# Patient Record
Sex: Female | Born: 1935 | Race: Black or African American | Hispanic: No | Smoking: Never smoker
Health system: Southern US, Community
[De-identification: ages and names within clinical notes are randomized; demographics above are authoritative.]

## PROBLEM LIST (undated history)

## (undated) DIAGNOSIS — R001 Bradycardia, unspecified: Secondary | ICD-10-CM

## (undated) DIAGNOSIS — D649 Anemia, unspecified: Secondary | ICD-10-CM

## (undated) DIAGNOSIS — N189 Chronic kidney disease, unspecified: Secondary | ICD-10-CM

## (undated) DIAGNOSIS — R739 Hyperglycemia, unspecified: Secondary | ICD-10-CM

## (undated) DIAGNOSIS — I509 Heart failure, unspecified: Secondary | ICD-10-CM

## (undated) DIAGNOSIS — E611 Iron deficiency: Secondary | ICD-10-CM

## (undated) DIAGNOSIS — E785 Hyperlipidemia, unspecified: Secondary | ICD-10-CM

## (undated) DIAGNOSIS — R1312 Dysphagia, oropharyngeal phase: Secondary | ICD-10-CM

## (undated) DIAGNOSIS — R0609 Other forms of dyspnea: Secondary | ICD-10-CM

## (undated) DIAGNOSIS — I639 Cerebral infarction, unspecified: Secondary | ICD-10-CM

## (undated) DIAGNOSIS — G8929 Other chronic pain: Secondary | ICD-10-CM

## (undated) DIAGNOSIS — R51 Headache: Secondary | ICD-10-CM

## (undated) DIAGNOSIS — R1084 Generalized abdominal pain: Secondary | ICD-10-CM

## (undated) DIAGNOSIS — I110 Hypertensive heart disease with heart failure: Secondary | ICD-10-CM

## (undated) DIAGNOSIS — J3800 Paralysis of vocal cords and larynx, unspecified: Secondary | ICD-10-CM

## (undated) DIAGNOSIS — I272 Pulmonary hypertension, unspecified: Secondary | ICD-10-CM

## (undated) DIAGNOSIS — I351 Nonrheumatic aortic (valve) insufficiency: Secondary | ICD-10-CM

## (undated) DIAGNOSIS — E559 Vitamin D deficiency, unspecified: Secondary | ICD-10-CM

## (undated) DIAGNOSIS — I517 Cardiomegaly: Secondary | ICD-10-CM

## (undated) DIAGNOSIS — G4733 Obstructive sleep apnea (adult) (pediatric): Secondary | ICD-10-CM

## (undated) DIAGNOSIS — L659 Nonscarring hair loss, unspecified: Secondary | ICD-10-CM

## (undated) DIAGNOSIS — K635 Polyp of colon: Secondary | ICD-10-CM

## (undated) DIAGNOSIS — Z7901 Long term (current) use of anticoagulants: Secondary | ICD-10-CM

## (undated) DIAGNOSIS — R270 Ataxia, unspecified: Secondary | ICD-10-CM

## (undated) DIAGNOSIS — I6990 Unspecified sequelae of unspecified cerebrovascular disease: Secondary | ICD-10-CM

## (undated) DIAGNOSIS — I10 Essential (primary) hypertension: Secondary | ICD-10-CM

## (undated) DIAGNOSIS — E039 Hypothyroidism, unspecified: Secondary | ICD-10-CM

## (undated) DIAGNOSIS — K259 Gastric ulcer, unspecified as acute or chronic, without hemorrhage or perforation: Secondary | ICD-10-CM

## (undated) DIAGNOSIS — R0600 Dyspnea, unspecified: Secondary | ICD-10-CM

## (undated) DIAGNOSIS — I739 Peripheral vascular disease, unspecified: Secondary | ICD-10-CM

## (undated) HISTORY — DX: Nonrheumatic aortic (valve) insufficiency: I35.1

## (undated) HISTORY — DX: Chronic kidney disease, unspecified: N18.9

## (undated) HISTORY — DX: Hypothyroidism, unspecified: E03.9

## (undated) HISTORY — DX: Dyspnea, unspecified: R06.00

## (undated) HISTORY — PX: TONSILLECTOMY: SUR1361

## (undated) HISTORY — DX: Dysphagia, oropharyngeal phase: R13.12

## (undated) HISTORY — DX: Anemia, unspecified: D64.9

## (undated) HISTORY — DX: Pulmonary hypertension, unspecified: I27.20

## (undated) HISTORY — DX: Paralysis of vocal cords and larynx, unspecified: J38.00

## (undated) HISTORY — DX: Unspecified sequelae of unspecified cerebrovascular disease: I69.90

## (undated) HISTORY — DX: Obstructive sleep apnea (adult) (pediatric): G47.33

## (undated) HISTORY — DX: Heart failure, unspecified: I50.9

## (undated) HISTORY — DX: Hyperglycemia, unspecified: R73.9

## (undated) HISTORY — DX: Cerebral infarction, unspecified: I63.9

## (undated) HISTORY — DX: Peripheral vascular disease, unspecified: I73.9

## (undated) HISTORY — DX: Headache: R51

## (undated) HISTORY — DX: Other forms of dyspnea: R06.09

## (undated) HISTORY — DX: Polyp of colon: K63.5

## (undated) HISTORY — DX: Bradycardia, unspecified: R00.1

## (undated) HISTORY — DX: Hyperlipidemia, unspecified: E78.5

## (undated) HISTORY — DX: Other chronic pain: G89.29

## (undated) HISTORY — DX: Nonscarring hair loss, unspecified: L65.9

## (undated) HISTORY — DX: Ataxia, unspecified: R27.0

## (undated) HISTORY — DX: Hypertensive heart disease with heart failure: I11.0

## (undated) HISTORY — DX: Vitamin D deficiency, unspecified: E55.9

## (undated) HISTORY — PX: ABDOMINAL HYSTERECTOMY: SHX81

## (undated) HISTORY — PX: KNEE SURGERY: SHX244

## (undated) HISTORY — PX: CHOLECYSTECTOMY: SHX55

## (undated) HISTORY — PX: NASAL SINUS SURGERY: SHX719

## (undated) HISTORY — DX: Cardiomegaly: I51.7

## (undated) HISTORY — PX: OVARIAN CYST REMOVAL: SHX89

## (undated) HISTORY — DX: Long term (current) use of anticoagulants: Z79.01

## (undated) HISTORY — DX: Generalized abdominal pain: R10.84

## (undated) HISTORY — DX: Iron deficiency: E61.1

---

## 2012-10-09 ENCOUNTER — Encounter (HOSPITAL_BASED_OUTPATIENT_CLINIC_OR_DEPARTMENT_OTHER): Payer: Self-pay

## 2012-10-09 ENCOUNTER — Emergency Department (HOSPITAL_BASED_OUTPATIENT_CLINIC_OR_DEPARTMENT_OTHER)
Admission: EM | Admit: 2012-10-09 | Discharge: 2012-10-09 | Disposition: A | Discharge status: Home / Self Care | Payer: Medicare Other | Attending: Emergency Medicine | Admitting: Emergency Medicine

## 2012-10-09 ENCOUNTER — Emergency Department (HOSPITAL_BASED_OUTPATIENT_CLINIC_OR_DEPARTMENT_OTHER): Payer: Medicare Other

## 2012-10-09 DIAGNOSIS — I10 Essential (primary) hypertension: Secondary | ICD-10-CM | POA: Insufficient documentation

## 2012-10-09 DIAGNOSIS — Z8719 Personal history of other diseases of the digestive system: Secondary | ICD-10-CM | POA: Insufficient documentation

## 2012-10-09 DIAGNOSIS — Z79899 Other long term (current) drug therapy: Secondary | ICD-10-CM | POA: Insufficient documentation

## 2012-10-09 DIAGNOSIS — I160 Hypertensive urgency: Secondary | ICD-10-CM

## 2012-10-09 DIAGNOSIS — R51 Headache: Secondary | ICD-10-CM | POA: Insufficient documentation

## 2012-10-09 DIAGNOSIS — Z880 Allergy status to penicillin: Secondary | ICD-10-CM | POA: Insufficient documentation

## 2012-10-09 HISTORY — DX: Gastric ulcer, unspecified as acute or chronic, without hemorrhage or perforation: K25.9

## 2012-10-09 HISTORY — DX: Essential (primary) hypertension: I10

## 2012-10-09 LAB — COMPREHENSIVE METABOLIC PANEL
AST: 20 U/L (ref 0–37)
Albumin: 4 g/dL (ref 3.5–5.2)
Calcium: 9.6 mg/dL (ref 8.4–10.5)
Chloride: 103 mEq/L (ref 96–112)
Creatinine, Ser: 1 mg/dL (ref 0.50–1.10)
Sodium: 140 mEq/L (ref 135–145)
Total Bilirubin: 0.6 mg/dL (ref 0.3–1.2)

## 2012-10-09 LAB — CBC WITH DIFFERENTIAL/PLATELET
Basophils Absolute: 0 10*3/uL (ref 0.0–0.1)
Basophils Relative: 0 % (ref 0–1)
HCT: 40.6 % (ref 36.0–46.0)
MCHC: 32.8 g/dL (ref 30.0–36.0)
Monocytes Absolute: 0.4 10*3/uL (ref 0.1–1.0)
Neutro Abs: 2 10*3/uL (ref 1.7–7.7)
Platelets: 160 10*3/uL (ref 150–400)
RDW: 13.3 % (ref 11.5–15.5)

## 2012-10-09 MED ORDER — CLONIDINE HCL 0.1 MG PO TABS: 0.1000 mg | ORAL_TABLET | Freq: Two times a day (BID) | ORAL | Status: DC

## 2012-10-09 MED ORDER — CLONIDINE HCL 0.1 MG PO TABS
0.2000 mg | ORAL_TABLET | Freq: Once | ORAL | Status: AC
  Administered 2012-10-09: 0.2 mg via ORAL
  Filled 2012-10-09: qty 2

## 2012-10-09 NOTE — ED Provider Notes (Signed)
History     CSN: [id number]  Arrival date & time 10/09/12  1143   First MD Initiated Contact with Patient 10/09/12 1154      Chief Complaint  Patient presents with  . Headache  . Hypertension    (Consider location/radiation/quality/duration/timing/severity/associated sxs/prior treatment) HPI Comments: Patient sent here from UC for evaluation of headaches and elevated blood pressure for the past three days.  She denies any chest pain or shortness of breath.  She is here from out of town and her pcp is in [place].  Patient is a 79 y.o. female presenting with headaches and hypertension. The history is provided by the patient.  Headache Pain location:  Generalized Quality:  Dull Radiates to:  Does not radiate Onset quality:  Sudden Duration:  3 days Timing:  Constant Progression:  Worsening Chronicity:  New Context: activity   Relieved by:  Nothing Worsened by:  Nothing tried Ineffective treatments:  None tried Hypertension Associated symptoms include headaches.    Past Medical History  Diagnosis Date  . Hypertension   . Gastric ulcer     Past Surgical History  Procedure Laterality Date  . Cholecystectomy    . Tonsillectomy    . Ovarian cyst removal    . Abdominal hysterectomy    . Knee surgery      No family history on file.  History  Substance Use Topics  . Smoking status: Never Smoker   . Smokeless tobacco: Not on file  . Alcohol Use: Yes     Comment: occasional    OB History   Grav Para Term Preterm Abortions TAB SAB Ect Mult Living                  Review of Systems  Neurological: Positive for headaches.  All other systems reviewed and are negative.    Allergies  Penicillins  Home Medications   Current Outpatient Rx  Name  Route  Sig  Dispense  Refill  . Calcium Carbonate-Vit D-Min (CALCIUM 1200 PO)   Oral   Take by mouth.         . Cetirizine HCl (ZYRTEC PO)   Oral   Take by mouth.         . cyanocobalamin 1000 MCG tablet  Oral   Take 100 mcg by mouth daily.         . diazepam (VALIUM) 5 MG tablet   Oral   Take 5 mg by mouth at bedtime as needed for anxiety.         . Multiple Vitamin (MULTIVITAMIN) capsule   Oral   Take 1 capsule by mouth daily.         . nisoldipine (SULAR) 17 MG 24 hr tablet   Oral   Take 17 mg by mouth daily.         Marland Kitchen omeprazole (PRILOSEC) 40 MG capsule   Oral   Take 40 mg by mouth daily.         . valsartan (DIOVAN) 320 MG tablet   Oral   Take 320 mg by mouth daily.         Marland Kitchen VITAMIN D, CHOLECALCIFEROL, PO   Oral   Take by mouth.           BP 168/60  Pulse 70  Temp(Src) 97.7 F (36.5 C) (Oral)  Resp 16  Ht 5\' 2"  (1.575 m)  Wt 164 lb (74.39 kg)  BMI 29.99 kg/m2  SpO2 99%  Physical Exam  Nursing note and  vitals reviewed. Constitutional: She is oriented to person, place, and time. She appears well-developed and well-nourished. No distress.  HENT:  Head: Normocephalic and atraumatic.  Mouth/Throat: Oropharynx is clear and moist.  Eyes: EOM are normal. Pupils are equal, round, and reactive to light.  No papilledema upon fundoscopic exam.   Neck: Normal range of motion. Neck supple.  Cardiovascular: Normal rate and regular rhythm.  Exam reveals no gallop and no friction rub.   No murmur heard. Pulmonary/Chest: Effort normal and breath sounds normal. No respiratory distress. She has no wheezes.  Abdominal: Soft. Bowel sounds are normal. She exhibits no distension. There is no tenderness.  Musculoskeletal: Normal range of motion.  Neurological: She is alert and oriented to person, place, and time. No cranial nerve deficit. She exhibits normal muscle tone. Coordination normal.  Skin: Skin is warm and dry. She is not diaphoretic.    ED Course  Procedures (including critical care time)  Labs Reviewed  CBC WITH DIFFERENTIAL - Abnormal; Notable for the following:    WBC 3.3 (*)    All other components within normal limits  COMPREHENSIVE METABOLIC  PANEL - Abnormal; Notable for the following:    Total Protein 8.5 (*)    GFR calc non Af Amer 53 (*)    GFR calc Af Amer 62 (*)    All other components within normal limits   Ct Head Wo Contrast  10/09/2012   *RADIOLOGY REPORT*  Clinical Data: Headache.  Hypertension  CT HEAD WITHOUT CONTRAST  Technique:  Contiguous axial images were obtained from the base of the skull through the vertex without contrast.  Comparison: None  Findings: Ventricle size is normal.  Patchy hypodensity throughout the cerebral white matter, most consistent with chronic microvascular ischemia.  No acute infarct, hemorrhage, or mass.  Mucosal edema in the sphenoid sinus.  No air-fluid level.  IMPRESSION: Chronic microvascular ischemia.  No acute abnormality.  Sphenoid sinus mucosal edema.   Original Report Authenticated By: Janeece Riggers, M.D.     No diagnosis found.   Date: 10/09/2012  Rate: 50  Rhythm: normal sinus rhythm  QRS Axis: normal  Intervals: normal  ST/T Wave abnormalities: normal  Conduction Disutrbances:none  Narrative Interpretation:   Old EKG Reviewed: none available    MDM  The patient presents here with headache and elevated blood pressure.  Both have resolved with clonidine in the ED.  The workup reveals a normal ekg, head ct, and labs.  She seems appropriate for discharge, to return prn.  I will add clonidine to her medications and she is to follow up with her pcp in the next 1-2 weeks.        Geoffery Lyons, MD 10/09/12 1431

## 2012-10-09 NOTE — ED Notes (Signed)
Return from xray

## 2012-10-09 NOTE — ED Notes (Signed)
Patient transported to CT 

## 2012-10-09 NOTE — ED Notes (Signed)
Pt reprts a 3 day history of a headache.  She was seen at Urgent Care and sent to ED for evaluation of hypertension.

## 2012-11-16 ENCOUNTER — Encounter (HOSPITAL_BASED_OUTPATIENT_CLINIC_OR_DEPARTMENT_OTHER): Payer: Self-pay | Admitting: *Deleted

## 2012-11-16 ENCOUNTER — Emergency Department (HOSPITAL_BASED_OUTPATIENT_CLINIC_OR_DEPARTMENT_OTHER)
Admission: EM | Admit: 2012-11-16 | Discharge: 2012-11-16 | Disposition: A | Discharge status: Home / Self Care | Payer: Medicare Other | Attending: Emergency Medicine | Admitting: Emergency Medicine

## 2012-11-16 DIAGNOSIS — R42 Dizziness and giddiness: Secondary | ICD-10-CM | POA: Insufficient documentation

## 2012-11-16 DIAGNOSIS — R5381 Other malaise: Secondary | ICD-10-CM | POA: Insufficient documentation

## 2012-11-16 DIAGNOSIS — R51 Headache: Secondary | ICD-10-CM | POA: Insufficient documentation

## 2012-11-16 DIAGNOSIS — N289 Disorder of kidney and ureter, unspecified: Secondary | ICD-10-CM | POA: Insufficient documentation

## 2012-11-16 DIAGNOSIS — Z880 Allergy status to penicillin: Secondary | ICD-10-CM | POA: Insufficient documentation

## 2012-11-16 DIAGNOSIS — R450 Nervousness: Secondary | ICD-10-CM | POA: Insufficient documentation

## 2012-11-16 DIAGNOSIS — Z79899 Other long term (current) drug therapy: Secondary | ICD-10-CM | POA: Insufficient documentation

## 2012-11-16 DIAGNOSIS — R490 Dysphonia: Secondary | ICD-10-CM | POA: Insufficient documentation

## 2012-11-16 DIAGNOSIS — I10 Essential (primary) hypertension: Secondary | ICD-10-CM | POA: Insufficient documentation

## 2012-11-16 DIAGNOSIS — Z8719 Personal history of other diseases of the digestive system: Secondary | ICD-10-CM | POA: Insufficient documentation

## 2012-11-16 DIAGNOSIS — R251 Tremor, unspecified: Secondary | ICD-10-CM

## 2012-11-16 DIAGNOSIS — R5383 Other fatigue: Secondary | ICD-10-CM | POA: Insufficient documentation

## 2012-11-16 DIAGNOSIS — F411 Generalized anxiety disorder: Secondary | ICD-10-CM | POA: Insufficient documentation

## 2012-11-16 DIAGNOSIS — R259 Unspecified abnormal involuntary movements: Secondary | ICD-10-CM | POA: Insufficient documentation

## 2012-11-16 LAB — CBC WITH DIFFERENTIAL/PLATELET
Eosinophils Absolute: 0.1 10*3/uL (ref 0.0–0.7)
Eosinophils Relative: 2 % (ref 0–5)
Hemoglobin: 12.6 g/dL (ref 12.0–15.0)
Lymphs Abs: 1.5 10*3/uL (ref 0.7–4.0)
MCH: 28.8 pg (ref 26.0–34.0)
MCV: 88.8 fL (ref 78.0–100.0)
Monocytes Relative: 10 % (ref 3–12)
RBC: 4.37 MIL/uL (ref 3.87–5.11)

## 2012-11-16 LAB — URINALYSIS, ROUTINE W REFLEX MICROSCOPIC
Ketones, ur: NEGATIVE mg/dL
Nitrite: NEGATIVE
Specific Gravity, Urine: 1.007 (ref 1.005–1.030)
pH: 5.5 (ref 5.0–8.0)

## 2012-11-16 LAB — URINE MICROSCOPIC-ADD ON

## 2012-11-16 LAB — COMPREHENSIVE METABOLIC PANEL
Alkaline Phosphatase: 49 U/L (ref 39–117)
BUN: 21 mg/dL (ref 6–23)
Calcium: 10.2 mg/dL (ref 8.4–10.5)
GFR calc Af Amer: 49 mL/min — ABNORMAL LOW (ref >90)
Glucose, Bld: 102 mg/dL — ABNORMAL HIGH (ref 70–99)
Total Protein: 8.3 g/dL (ref 6.0–8.3)

## 2012-11-16 NOTE — ED Notes (Signed)
Pt. Reports what brought her here today is the chills.  Pt. Is in no distress and has history of anxiety issues.

## 2012-11-16 NOTE — ED Notes (Signed)
Chart reviewed and care assumed. 

## 2012-11-16 NOTE — ED Provider Notes (Signed)
History     This chart was scribed for Rolan Bucco, MD by Jiles Prows, ED Scribe. The patient was seen in room MH05/MH05 and the patient's care was started at 3:58 PM.   CSN: [id number] Arrival date & time 11/16/12  1540   Chief Complaint  Patient presents with  . Chills   The history is provided by the patient and medical records. No language interpreter was used.   HPI Comments: Morgan Tyler is a 79 y.o. female with a h/o HTN who presents to the Emergency Department complaining of chills onset 3 weeks ago.  Pt reports that chills were intermittent until today when they became constant.  Pt complains of light headedness, loss of balance, and "shaky nervousness."  She reports that she has had anxiety attacks in the past, but they have not been diagnosed.  She claims usually her hands shake when she is nervous, but today her whole body was feeling shaky. She took 2 doses of her valium today which she takes for the nervousness, but did not have improvement. She has been told by her PMD that she might have early Parkinson's.  Pt reports a new HTN medication 2 weeks ago.  She reports bp of 158/79 yesterday at a machine in [facility].  She claims she cannot breathe properly but denies SOB.  She states that there has been a change in her breathing.  Pt claims dizziness when she moves her head.  She reports the has had this before.  She also complains of hoarse voice, headache and about 2 weeks of trouble speaking.  She states that the occasional trouble speaking and balance problems/dizziness have been going on for "awhile" and are actually better today.  She states that the headache is mild today.  She complains of chronic sinus issues and was recently seen by an ENT and treated with Flonase.  She also claims intermittent pain radiating down neck bilaterally at times, but none now.  Pt denies dysuria, congestion, chest pain, diaphoresis, fever, nausea, vomiting, diarrhea, cough and any other pain.   The  main reason that she came in today was for the chills and feeling cold all day.  She denies numbness or weakness to extremities.  Dr. Carlyon Prows at [facility] is PCP while pt is visiting [place].  She reports a scheduled test for the 29th with this Doctor for the dizziness and tremors.  She describes this test as "putting electrodes to head."  She reports CT scan here 2 weeks ago, but denies MRI.  Pt reports eye exam with no complications last week.  Pt reports family history of Parkinson's Disease.    Past Medical History  Diagnosis Date  . Hypertension   . Gastric ulcer    Past Surgical History  Procedure Laterality Date  . Cholecystectomy    . Tonsillectomy    . Ovarian cyst removal    . Abdominal hysterectomy    . Knee surgery     No family history on file. History  Substance Use Topics  . Smoking status: Never Smoker   . Smokeless tobacco: Not on file  . Alcohol Use: Yes     Comment: occasional   OB History   Grav Para Term Preterm Abortions TAB SAB Ect Mult Living                 Review of Systems  Constitutional: Positive for chills. Negative for fever, diaphoresis and fatigue.  HENT: Negative for congestion, rhinorrhea and sneezing.   Eyes:  Negative.   Respiratory: Negative for cough, chest tightness and shortness of breath.   Cardiovascular: Negative for chest pain and leg swelling.  Gastrointestinal: Negative for nausea, vomiting, abdominal pain, diarrhea and blood in stool.  Genitourinary: Negative for frequency, hematuria, flank pain and difficulty urinating.  Musculoskeletal: Negative for back pain and arthralgias.  Skin: Negative for rash.  Neurological: Positive for dizziness, weakness, light-headedness and headaches. Negative for speech difficulty and numbness.  Psychiatric/Behavioral: The patient is nervous/anxious.   All other systems reviewed and are negative.    Allergies  Penicillins  Home Medications   Current Outpatient Rx  Name  Route  Sig   Dispense  Refill  . Calcium Carbonate-Vit D-Min (CALCIUM 1200 PO)   Oral   Take by mouth.         . Cetirizine HCl (ZYRTEC PO)   Oral   Take by mouth.         . cloNIDine (CATAPRES) 0.1 MG tablet   Oral   Take 1 tablet (0.1 mg total) by mouth 2 (two) times daily.   60 tablet   1   . cyanocobalamin 1000 MCG tablet   Oral   Take 100 mcg by mouth daily.         . diazepam (VALIUM) 5 MG tablet   Oral   Take 5 mg by mouth at bedtime as needed for anxiety.         . Multiple Vitamin (MULTIVITAMIN) capsule   Oral   Take 1 capsule by mouth daily.         . nisoldipine (SULAR) 17 MG 24 hr tablet   Oral   Take 17 mg by mouth daily.         Marland Kitchen omeprazole (PRILOSEC) 40 MG capsule   Oral   Take 40 mg by mouth daily.         . valsartan (DIOVAN) 320 MG tablet   Oral   Take 160 mg by mouth daily.          Marland Kitchen VITAMIN D, CHOLECALCIFEROL, PO   Oral   Take by mouth.          BP 184/68  Pulse 81  Temp(Src) 97.5 F (36.4 C) (Oral)  Resp 18  Wt 163 lb (73.936 kg)  BMI 29.81 kg/m2  SpO2 99% Physical Exam  Constitutional: She is oriented to person, place, and time. She appears well-developed and well-nourished.  HENT:  Head: Normocephalic and atraumatic.  Mouth/Throat: Oropharynx is clear and moist.  Eyes: Pupils are equal, round, and reactive to light.  No nystagmus  Neck: Normal range of motion. Neck supple.  Cardiovascular: Normal rate, regular rhythm and normal heart sounds.   Pulmonary/Chest: Effort normal and breath sounds normal. No respiratory distress. She has no wheezes. She has no rales. She exhibits no tenderness.  Abdominal: Soft. Bowel sounds are normal. There is no tenderness. There is no rebound and no guarding.  Musculoskeletal: Normal range of motion. She exhibits no edema.  Lymphadenopathy:    She has no cervical adenopathy.  Neurological: She is alert and oriented to person, place, and time. She has normal strength. No cranial nerve  deficit or sensory deficit. GCS eye subscore is 4. GCS verbal subscore is 5. GCS motor subscore is 6.  FTN intact  Skin: Skin is warm and dry. No rash noted.  Psychiatric: She has a normal mood and affect.    ED Course  Procedures (including critical care time) DIAGNOSTIC STUDIES: Oxygen Saturation is 99%  on RA, normal by my interpretation.    COORDINATION OF CARE: 4:08 PM - Discussed ED treatment with pt at bedside including urinalysis and blood work and pt agrees.   Date: 11/16/2012  Rate: 81  Rhythm: normal sinus rhythm  QRS Axis: normal  Intervals: normal  ST/T Wave abnormalities: normal  Conduction Disutrbances:none  Narrative Interpretation:   Old EKG Reviewed: unchanged Results for orders placed during the hospital encounter of 11/16/12  CBC WITH DIFFERENTIAL      Result Value Range   WBC 4.3  4.0 - 10.5 K/uL   RBC 4.37  3.87 - 5.11 MIL/uL   Hemoglobin 12.6  12.0 - 15.0 g/dL   HCT 91.4  78.2 - 95.6 %   MCV 88.8  78.0 - 100.0 fL   MCH 28.8  26.0 - 34.0 pg   MCHC 32.5  30.0 - 36.0 g/dL   RDW 21.3  08.6 - 57.8 %   Platelets 200  150 - 400 K/uL   Neutrophils Relative % 53  43 - 77 %   Neutro Abs 2.3  1.7 - 7.7 K/uL   Lymphocytes Relative 35  12 - 46 %   Lymphs Abs 1.5  0.7 - 4.0 K/uL   Monocytes Relative 10  3 - 12 %   Monocytes Absolute 0.4  0.1 - 1.0 K/uL   Eosinophils Relative 2  0 - 5 %   Eosinophils Absolute 0.1  0.0 - 0.7 K/uL   Basophils Relative 1  0 - 1 %   Basophils Absolute 0.0  0.0 - 0.1 K/uL  COMPREHENSIVE METABOLIC PANEL      Result Value Range   Sodium 143  135 - 145 mEq/L   Potassium 3.9  3.5 - 5.1 mEq/L   Chloride 103  96 - 112 mEq/L   CO2 25  19 - 32 mEq/L   Glucose, Bld 102 (*) 70 - 99 mg/dL   BUN 21  6 - 23 mg/dL   Creatinine, Ser 4.69 (*) 0.50 - 1.10 mg/dL   Calcium 62.9  8.4 - 52.8 mg/dL   Total Protein 8.3  6.0 - 8.3 g/dL   Albumin 4.3  3.5 - 5.2 g/dL   AST 23  0 - 37 U/L   ALT 19  0 - 35 U/L   Alkaline Phosphatase 49  39 - 117 U/L    Total Bilirubin 0.5  0.3 - 1.2 mg/dL   GFR calc non Af Amer 42 (*) >90 mL/min   GFR calc Af Amer 49 (*) >90 mL/min  URINALYSIS, ROUTINE W REFLEX MICROSCOPIC      Result Value Range   Color, Urine YELLOW  YELLOW   APPearance CLEAR  CLEAR   Specific Gravity, Urine 1.007  1.005 - 1.030   pH 5.5  5.0 - 8.0   Glucose, UA NEGATIVE  NEGATIVE mg/dL   Hgb urine dipstick NEGATIVE  NEGATIVE   Bilirubin Urine NEGATIVE  NEGATIVE   Ketones, ur NEGATIVE  NEGATIVE mg/dL   Protein, ur NEGATIVE  NEGATIVE mg/dL   Urobilinogen, UA 0.2  0.0 - 1.0 mg/dL   Nitrite NEGATIVE  NEGATIVE   Leukocytes, UA SMALL (*) NEGATIVE  URINE MICROSCOPIC-ADD ON      Result Value Range   Squamous Epithelial / LPF RARE  RARE   WBC, UA 3-6  <3 WBC/hpf   Bacteria, UA RARE  RARE   No results found.    No results found. 1. Tremors of nervous system   2. Renal insufficiency  MDM  Patient is well-appearing. She presents with chills and worsening tremor. Most of the complaints that she's been having including the tremor B. occasional speech difficulties and the intermittent balance problems with associated dizziness have been going on for a while. She states today the only change was worsening tremor and feeling cold all day. I don't find any source of infection. She has no urinary symptoms. Her urine was sent for culture. She has no fevers or other signs of infection. She has new acute stroke symptoms. She's been followed by [facility] and is scheduled for what sounds like an EEG. I also advised the patient that she might want to discuss having an MRI as it doesn't sound like she's had this. I don't feel that she needs an emergent MRI today as the symptoms have been going on for several weeks. She had some mild elevation in her creatinine which I discussed with her and this will need be rechecked by her primary care physician. Her blood pressure has come down back to normal and there is nothing suggestive of a  hypertensive emergency or urgency.  I personally performed the services described in this documentation, which was scribed in my presence.  The recorded information has been reviewed and considered.      Rolan Bucco, MD 11/16/12 [phone]

## 2012-11-16 NOTE — ED Notes (Signed)
MD at bedside. 

## 2012-11-16 NOTE — ED Notes (Signed)
Pt. Able to follow commands with no difficulty.  Pt. Reports she has a pending test for her dizziness.

## 2012-11-16 NOTE — ED Notes (Signed)
Chills and lightheaded all day. She denies fever. Alert oriented. Headache.

## 2012-11-16 NOTE — ED Notes (Signed)
Pt is unable to void at present time. 

## 2012-11-16 NOTE — ED Notes (Signed)
Pt. Has multiple complaints with her major complaint of chills.  Pt. Being assessed by Dr. Fredderick Phenix at this time.

## 2012-11-16 NOTE — ED Notes (Signed)
Attempted blood draw x2 unsuccessful 

## 2012-11-16 NOTE — ED Notes (Signed)
Pt reports she cant "pee" now

## 2012-12-29 ENCOUNTER — Emergency Department (HOSPITAL_BASED_OUTPATIENT_CLINIC_OR_DEPARTMENT_OTHER)
Admission: EM | Admit: 2012-12-29 | Discharge: 2012-12-29 | Disposition: A | Discharge status: Home / Self Care | Payer: Medicare Other | Attending: Emergency Medicine | Admitting: Emergency Medicine

## 2012-12-29 ENCOUNTER — Encounter (HOSPITAL_BASED_OUTPATIENT_CLINIC_OR_DEPARTMENT_OTHER): Payer: Self-pay | Admitting: *Deleted

## 2012-12-29 DIAGNOSIS — X58XXXA Exposure to other specified factors, initial encounter: Secondary | ICD-10-CM | POA: Insufficient documentation

## 2012-12-29 DIAGNOSIS — K259 Gastric ulcer, unspecified as acute or chronic, without hemorrhage or perforation: Secondary | ICD-10-CM | POA: Insufficient documentation

## 2012-12-29 DIAGNOSIS — I10 Essential (primary) hypertension: Secondary | ICD-10-CM | POA: Insufficient documentation

## 2012-12-29 DIAGNOSIS — Y939 Activity, unspecified: Secondary | ICD-10-CM | POA: Insufficient documentation

## 2012-12-29 DIAGNOSIS — Z880 Allergy status to penicillin: Secondary | ICD-10-CM | POA: Insufficient documentation

## 2012-12-29 DIAGNOSIS — Y929 Unspecified place or not applicable: Secondary | ICD-10-CM | POA: Insufficient documentation

## 2012-12-29 DIAGNOSIS — S4492XA Injury of unspecified nerve at shoulder and upper arm level, left arm, initial encounter: Secondary | ICD-10-CM

## 2012-12-29 DIAGNOSIS — Z79899 Other long term (current) drug therapy: Secondary | ICD-10-CM | POA: Insufficient documentation

## 2012-12-29 DIAGNOSIS — S4490XA Injury of unspecified nerve at shoulder and upper arm level, unspecified arm, initial encounter: Secondary | ICD-10-CM | POA: Insufficient documentation

## 2012-12-29 NOTE — ED Provider Notes (Signed)
CSN: [id number]     Arrival date & time 12/29/12  1142 History   First MD Initiated Contact with Patient 12/29/12 1153     Chief Complaint  Patient presents with  . Hand Pain   (Consider location/radiation/quality/duration/timing/severity/associated sxs/prior Treatment) HPI Comments: Pt states that she woke up this morning with tingling in her left 2nd, 3rd and 4th digits:pt denies numbness and weakness to the area:pt states that she has a history of similar symptom, but the sensation doesn't usually last this long:pt states that she does have cervical disc problem:pt states that she has not had any problems with speech, grip:pt states that she has has an ongoing history of gait problems and weakness:denies cp, sob  The history is provided by the patient. No language interpreter was used.    Past Medical History  Diagnosis Date  . Hypertension   . Gastric ulcer    Past Surgical History  Procedure Laterality Date  . Cholecystectomy    . Tonsillectomy    . Ovarian cyst removal    . Abdominal hysterectomy    . Knee surgery     History reviewed. No pertinent family history. History  Substance Use Topics  . Smoking status: Never Smoker   . Smokeless tobacco: Not on file  . Alcohol Use: Yes     Comment: occasional   OB History   Grav Para Term Preterm Abortions TAB SAB Ect Mult Living                 Review of Systems  Constitutional: Negative.   Respiratory: Negative.   Cardiovascular: Negative.     Allergies  Penicillins  Home Medications   Current Outpatient Rx  Name  Route  Sig  Dispense  Refill  . amLODipine (NORVASC) 5 MG tablet   Oral   Take 5 mg by mouth daily.         . hydrALAZINE (APRESOLINE) 10 MG tablet   Oral   Take 30 mg by mouth daily.         . Calcium Carbonate-Vit D-Min (CALCIUM 1200 PO)   Oral   Take by mouth.         . Cetirizine HCl (ZYRTEC PO)   Oral   Take by mouth.         . cloNIDine (CATAPRES) 0.1 MG tablet   Oral  Take 1 tablet (0.1 mg total) by mouth 2 (two) times daily.   60 tablet   1   . cyanocobalamin 1000 MCG tablet   Oral   Take 100 mcg by mouth daily.         . diazepam (VALIUM) 5 MG tablet   Oral   Take 5 mg by mouth at bedtime as needed for anxiety.         . Multiple Vitamin (MULTIVITAMIN) capsule   Oral   Take 1 capsule by mouth daily.         . nisoldipine (SULAR) 17 MG 24 hr tablet   Oral   Take 17 mg by mouth daily.         Marland Kitchen omeprazole (PRILOSEC) 40 MG capsule   Oral   Take 40 mg by mouth daily.         . valsartan (DIOVAN) 320 MG tablet   Oral   Take 160 mg by mouth daily.          Marland Kitchen VITAMIN D, CHOLECALCIFEROL, PO   Oral   Take by mouth.  BP 173/83  Pulse 74  Resp 18  Ht 5\' 2"  (1.575 m)  Wt 160 lb (72.576 kg)  BMI 29.26 kg/m2  SpO2 94% Physical Exam  Nursing note and vitals reviewed. Constitutional: She is oriented to person, place, and time. She appears well-developed and well-nourished.  HENT:  Head: Normocephalic and atraumatic.  Cardiovascular: Normal rate and regular rhythm.   Pulmonary/Chest: Effort normal and breath sounds normal.  Musculoskeletal: Normal range of motion.  Neurological: She is alert and oriented to person, place, and time. She exhibits normal muscle tone. Coordination normal.  Skin: Skin is warm and dry.    ED Course  Procedures (including critical care time) Labs Review Labs Reviewed - No data to display Imaging Review No results found.  MDM   1. Neuropraxia of upper extremity, left, initial encounter    Pt given strict instructions on follow up:pt is seen by [facility] medical:doubt tia    Teressa Lower, NP 12/29/12 1356

## 2012-12-29 NOTE — ED Provider Notes (Signed)
Medical screening examination/treatment/procedure(s) were conducted as a shared visit with non-physician practitioner(s) and myself.  I personally evaluated the patient during the encounter  I interviewed and examined the pt. Abd soft. Lungs CTAB. Cardiac exam wnl. Normal strength in all extremities and grip. Sensation intact, although paresthesias noted in the fingertips of the left hand. Pt has had similar sx before upon awakening that the paresthesias usually resolve spontaneously. As she is otherwise neurologically intact, I doubt this is a TIA presentation. Suspect neuropraxia from the way the pt was sleeping as her sx are similar to previous episodes of this. I gave strong return precautions and would like the pt to f/u w/ her pcp tomorrow.   Junius Argyle, MD 12/29/12 [phone]

## 2012-12-29 NOTE — ED Notes (Signed)
Pt amb to room 2 with quick steady gait in nad. Pt reports tingling in her left fingers x this am. Pt reports some intermittent neck pain, denies any other c/o.

## 2012-12-29 NOTE — ED Notes (Signed)
Report received from Ninetta Lights, RN

## 2013-11-02 ENCOUNTER — Emergency Department (HOSPITAL_BASED_OUTPATIENT_CLINIC_OR_DEPARTMENT_OTHER): Payer: Medicare Other

## 2013-11-02 ENCOUNTER — Emergency Department (HOSPITAL_BASED_OUTPATIENT_CLINIC_OR_DEPARTMENT_OTHER)
Admission: EM | Admit: 2013-11-02 | Discharge: 2013-11-02 | Disposition: A | Discharge status: Home / Self Care | Payer: Medicare Other | Attending: Emergency Medicine | Admitting: Emergency Medicine

## 2013-11-02 ENCOUNTER — Encounter (HOSPITAL_BASED_OUTPATIENT_CLINIC_OR_DEPARTMENT_OTHER): Payer: Self-pay | Admitting: Emergency Medicine

## 2013-11-02 DIAGNOSIS — Z880 Allergy status to penicillin: Secondary | ICD-10-CM | POA: Insufficient documentation

## 2013-11-02 DIAGNOSIS — R609 Edema, unspecified: Secondary | ICD-10-CM | POA: Insufficient documentation

## 2013-11-02 DIAGNOSIS — R42 Dizziness and giddiness: Secondary | ICD-10-CM | POA: Insufficient documentation

## 2013-11-02 DIAGNOSIS — R002 Palpitations: Secondary | ICD-10-CM | POA: Insufficient documentation

## 2013-11-02 DIAGNOSIS — Z79899 Other long term (current) drug therapy: Secondary | ICD-10-CM | POA: Insufficient documentation

## 2013-11-02 DIAGNOSIS — I10 Essential (primary) hypertension: Secondary | ICD-10-CM | POA: Insufficient documentation

## 2013-11-02 DIAGNOSIS — Z8719 Personal history of other diseases of the digestive system: Secondary | ICD-10-CM | POA: Insufficient documentation

## 2013-11-02 LAB — CBC WITH DIFFERENTIAL/PLATELET
BASOS PCT: 0 % (ref 0–1)
Basophils Absolute: 0 10*3/uL (ref 0.0–0.1)
Eosinophils Absolute: 0.1 10*3/uL (ref 0.0–0.7)
Eosinophils Relative: 3 % (ref 0–5)
HCT: 38.1 % (ref 36.0–46.0)
HEMOGLOBIN: 12.1 g/dL (ref 12.0–15.0)
Lymphocytes Relative: 32 % (ref 12–46)
Lymphs Abs: 1.5 10*3/uL (ref 0.7–4.0)
MCH: 28.9 pg (ref 26.0–34.0)
MCHC: 31.8 g/dL (ref 30.0–36.0)
MCV: 91.1 fL (ref 78.0–100.0)
MONO ABS: 0.5 10*3/uL (ref 0.1–1.0)
MONOS PCT: 10 % (ref 3–12)
NEUTROS ABS: 2.6 10*3/uL (ref 1.7–7.7)
Neutrophils Relative %: 55 % (ref 43–77)
Platelets: 194 10*3/uL (ref 150–400)
RBC: 4.18 MIL/uL (ref 3.87–5.11)
RDW: 13.6 % (ref 11.5–15.5)
WBC: 4.7 10*3/uL (ref 4.0–10.5)

## 2013-11-02 LAB — TROPONIN I
Troponin I: <0.3 ng/mL (ref <0.30)
Troponin I: <0.3 ng/mL (ref <0.30)

## 2013-11-02 LAB — BASIC METABOLIC PANEL
Anion gap: 16 — ABNORMAL HIGH (ref 5–15)
BUN: 26 mg/dL — ABNORMAL HIGH (ref 6–23)
CHLORIDE: 104 meq/L (ref 96–112)
CO2: 25 meq/L (ref 19–32)
CREATININE: 1.4 mg/dL — AB (ref 0.50–1.10)
Calcium: 10 mg/dL (ref 8.4–10.5)
GFR calc non Af Amer: 35 mL/min — ABNORMAL LOW (ref >90)
GFR, EST AFRICAN AMERICAN: 41 mL/min — AB (ref >90)
Glucose, Bld: 94 mg/dL (ref 70–99)
Potassium: 4.2 mEq/L (ref 3.7–5.3)
Sodium: 145 mEq/L (ref 137–147)

## 2013-11-02 LAB — TSH: TSH: 4.24 u[IU]/mL (ref 0.350–4.500)

## 2013-11-02 LAB — PRO B NATRIURETIC PEPTIDE: Pro B Natriuretic peptide (BNP): 450.4 pg/mL — ABNORMAL HIGH (ref 0–450)

## 2013-11-02 NOTE — Discharge Instructions (Signed)
Palpitations A palpitation is the feeling that your heartbeat is irregular or is faster than normal. It may feel like your heart is fluttering or skipping a beat. Palpitations are usually not a serious problem. However, in some cases, you may need further medical evaluation.  Your evaluation is reassuring at this time. He need to followup with your primary Dr. tomorrow for repeat evaluation and possible Holter monitoring. You should return if he has any new or worsening symptoms. CAUSES  Palpitations can be caused by:  Smoking.  Caffeine or other stimulants, such as diet pills or energy drinks.  Alcohol.  Stress and anxiety.  Strenuous physical activity.  Fatigue.  Certain medicines.  Heart disease, especially if you have a history of irregular heart rhythms (arrhythmias), such as atrial fibrillation, atrial flutter, or supraventricular tachycardia.  An improperly working pacemaker or defibrillator. DIAGNOSIS  To find the cause of your palpitations, your health care provider will take your medical history and perform a physical exam. Your health care provider may also have you take a test called an ambulatory electrocardiogram (ECG). An ECG records your heartbeat patterns over a 24-hour period. You may also have other tests, such as:  Transthoracic echocardiogram (TTE). During echocardiography, sound waves are used to evaluate how blood flows through your heart.  Transesophageal echocardiogram (TEE).  Cardiac monitoring. This allows your health care provider to monitor your heart rate and rhythm in real time.  Holter monitor. This is a portable device that records your heartbeat and can help diagnose heart arrhythmias. It allows your health care provider to track your heart activity for several days, if needed.  Stress tests by exercise or by giving medicine that makes the heart beat faster. TREATMENT  Treatment of palpitations depends on the cause of your symptoms and can vary  greatly. Most cases of palpitations do not require any treatment other than time, relaxation, and monitoring your symptoms. Other causes, such as atrial fibrillation, atrial flutter, or supraventricular tachycardia, usually require further treatment. HOME CARE INSTRUCTIONS   Avoid:  Caffeinated coffee, tea, soft drinks, diet pills, and energy drinks.  Chocolate.  Alcohol.  Stop smoking if you smoke.  Reduce your stress and anxiety. Things that can help you relax include:  A method of controlling things in your body, such as your heartbeats, with your mind (biofeedback).  Yoga.  Meditation.  Physical activity such as swimming, jogging, or walking.  Get plenty of rest and sleep. SEEK MEDICAL CARE IF:   You continue to have a fast or irregular heartbeat beyond 24 hours.  Your palpitations occur more often. SEEK IMMEDIATE MEDICAL CARE IF:  You have chest pain or shortness of breath.  You have a severe headache.  You feel dizzy or you faint. MAKE SURE YOU:  Understand these instructions.  Will watch your condition.  Will get help right away if you are not doing well or get worse. Document Released: 04/12/2000 Document Revised: 04/20/2013 Document Reviewed: 06/14/2011 [facility] Patient Information 2015 [employer] [place]. This information is not intended to replace advice given to you by your health care provider. Make sure you discuss any questions you have with your health care provider.

## 2013-11-02 NOTE — ED Provider Notes (Signed)
CSN: [id number]     Arrival date & time 11/02/13  1744 History  This chart was scribed for Morgan Batonourtney F Horton, MD by Bronson CurbJacqueline Melvin, ED Scribe. This patient was seen in room MH12/MH12 and the patient's care was started at 6:21 PM.    Chief Complaint  Patient presents with  . Palpitations     The history is provided by the patient. No language interpreter was used.    HPI Comments: Morgan BloomerMahalia Tyler is a 80 y.o. female who presents to the Emergency Department complaining of palpitations onset 1 week ago. Patient states her doctor reduced her amlodipine from 10mg  to 5mg  as a result of possible low blood pressures. She was informed by her neurologist that the medication was reducing her BP too much and suspects she was not getting enough blood to her brain. She reports taking ASA to compensate the reduction in amlodipine. There is assoicated dizziness. She reports these symptoms occur simultaneously. Patient describes her dizziness as "off-balance", and states it is worse when she moves from a lying position to a sitting position. She also reports her palpitations are worse in the morning after she takes her medication. Patient also reports bilateral leg swelling but states this is a side effect of the amlodipine. [place] from [place] here "part-time". She states she has a PCP in both locations. Dr. Elise Benneuran is her cardiologist here. Patient [facility], however, she was informed her readings and lab results have not yet resulted. Patient is here for her symptoms and for a heart monitor. Patient has history of HTN, mini-strokes, and a "thyroid problem" (last check 6 months ago).   Patient denies any history of blood clots or recent long travel.  Past Medical History  Diagnosis Date  . Hypertension   . Gastric ulcer    Past Surgical History  Procedure Laterality Date  . Cholecystectomy    . Tonsillectomy    . Ovarian cyst removal    . Abdominal  hysterectomy    . Knee surgery     No family history on file. History  Substance Use Topics  . Smoking status: Never Smoker   . Smokeless tobacco: Not on file  . Alcohol Use: Yes     Comment: occasional   OB History   Grav Para Term Preterm Abortions TAB SAB Ect Mult Living                 Review of Systems  Constitutional: Negative for fever.  Respiratory: Negative for cough, chest tightness and shortness of breath.   Cardiovascular: Positive for palpitations. Negative for chest pain and leg swelling.  Gastrointestinal: Negative for nausea, vomiting and abdominal pain.  Genitourinary: Negative for dysuria.  Musculoskeletal: Negative for back pain.  Skin: Negative for rash.  Neurological: Positive for dizziness. Negative for weakness, light-headedness, numbness and headaches.  Psychiatric/Behavioral: Negative for confusion.  All other systems reviewed and are negative.     Allergies  Penicillins  Home Medications   Prior to Admission medications   Medication Sig Start Date End Date Taking? Authorizing Provider  amLODipine (NORVASC) 5 MG tablet Take 5 mg by mouth daily.    Historical Provider, MD  Calcium Carbonate-Vit D-Min (CALCIUM 1200 PO) Take by mouth.    Historical Provider, MD  Cetirizine HCl (ZYRTEC PO) Take by mouth.    Historical Provider, MD  cloNIDine (CATAPRES) 0.1 MG tablet Take 1 tablet (0.1 mg total) by mouth 2 (two) times  daily. 10/09/12   Geoffery Lyonsouglas Delo, MD  cyanocobalamin 1000 MCG tablet Take 100 mcg by mouth daily.    Historical Provider, MD  diazepam (VALIUM) 5 MG tablet Take 5 mg by mouth at bedtime as needed for anxiety.    Historical Provider, MD  hydrALAZINE (APRESOLINE) 10 MG tablet Take 30 mg by mouth daily.    Historical Provider, MD  Multiple Vitamin (MULTIVITAMIN) capsule Take 1 capsule by mouth daily.    Historical Provider, MD  nisoldipine (SULAR) 17 MG 24 hr tablet Take 17 mg by mouth daily.    Historical Provider, MD  omeprazole (PRILOSEC)  40 MG capsule Take 40 mg by mouth daily.    Historical Provider, MD  valsartan (DIOVAN) 320 MG tablet Take 160 mg by mouth daily.     Historical Provider, MD  VITAMIN D, CHOLECALCIFEROL, PO Take by mouth.    Historical Provider, MD   Triage Vitals: BP 158/77  Temp(Src) 98.3 F (36.8 C) (Oral)  Resp 16  Ht 5\' 2"  (1.575 m)  Wt 160 lb (72.576 kg)  BMI 29.26 kg/m2  SpO2 100%  Physical Exam  Nursing note and vitals reviewed. Constitutional: She is oriented to person, place, and time. She appears well-developed and well-nourished. No distress.  HENT:  Head: Normocephalic and atraumatic.  Eyes: Pupils are equal, round, and reactive to light.  Neck: Neck supple.  Cardiovascular: Normal rate, regular rhythm and normal heart sounds.   No murmur heard. Pulmonary/Chest: Effort normal and breath sounds normal. No respiratory distress. She has no wheezes.  Abdominal: Soft. Bowel sounds are normal. There is no tenderness. There is no rebound.  Musculoskeletal: She exhibits edema.  1+ bilateral lower extremity edema, symmetric, no calf tenderness  Neurological: She is alert and oriented to person, place, and time.  5 out of 5 strength in all 4 extremities, no dysmetria to finger-nose-finger, cranial nerves II through XII intact  Skin: Skin is warm and dry.  Psychiatric: She has a normal mood and affect.    ED Course  Procedures (including critical care time)  DIAGNOSTIC STUDIES: Oxygen Saturation is 100% on room air, normal by my interpretation.    COORDINATION OF CARE: At 1832 Discussed treatment plan with patient which includes CXR and labs. Patient agrees.   Labs Review Labs Reviewed  PRO B NATRIURETIC PEPTIDE - Abnormal; Notable for the following:    Pro B Natriuretic peptide (BNP) 450.4 (*)    All other components within normal limits  BASIC METABOLIC PANEL - Abnormal; Notable for the following:    BUN 26 (*)    Creatinine, Ser 1.40 (*)    GFR calc non Af Amer 35 (*)    GFR  calc Af Amer 41 (*)    Anion gap 16 (*)    All other components within normal limits  TROPONIN I  TSH  CBC WITH DIFFERENTIAL  TROPONIN I    Imaging Review Dg Chest 2 View  11/02/2013   CLINICAL DATA:  Palpitations, shortness of breath.  EXAM: CHEST  2 VIEW  COMPARISON:  None.  FINDINGS: Heart is mildly enlarged. Linear densities in the left base, likely scarring. No confluent opacity on the right. No effusions. No edema.  Degenerative changes in the thoracic spine. Mild multilevel compression fractures in the mid to lower lumbar spine.  IMPRESSION: Cardiomegaly.  Left basilar scarring.   Electronically Signed   By: Charlett NoseKevin  Dover M.D.   On: 11/02/2013 19:52     EKG Interpretation   Date/Time:  Tuesday  November 02 2013 17:51:05 EDT Ventricular Rate:  56 PR Interval:  164 QRS Duration: 68 QT Interval:  416 QTC Calculation: 401 R Axis:   36 Text Interpretation:  Sinus bradycardia Otherwise normal ECG similar to  prior Confirmed by HORTON  MD, Toni Amend ([id number]) on 11/02/2013 5:55:51 PM      MDM   Final diagnoses:  Palpitations    Patient presents with primary complaint of palpitations. Patient also reports associated dizziness which is described as feeling off balance. Symptoms are primarily in the morning after she is taking her medications. They have worsened over the last week. Patient denies any chest pain or shortness of breath. Denies any weakness, numbness, or tingling. He is nontoxic and nonfocal on exam. Currently only endorses "mild palpitations."  Talked with her primary care physician today in [place] who requested her to seek evaluation for Holter monitoring. Basic labwork obtained. EKG is reassuring and similar to prior. Troponin x2 is negative.  H. is within normal limits. BMP is notable for mild elevation in creatinine. Baseline is around 1-1.2. Currently 1.4. No evidence of orthostasis. Patient has symmetric bilateral lower extremity edema with no evidence of unilateral  swelling. Low suspicion at this time for DVT or PE causing symptoms. Patient reports spontaneous improvement of symptoms. Symptoms appear to be ongoing and patient relates them to medication changes. Patient does have a primary doctor here in [facility]. Discuss results with the patient. Feel patient can follow up closely with her primary care physician and at that time inquire about Holter monitoring. Patient was given strict return precautions including onset of chest pain, shortness of breath, syncope, weakness or numbness.  After history, exam, and medical workup I feel the patient has been appropriately medically screened and is safe for discharge home. Pertinent diagnoses were discussed with the patient. Patient was given return precautions.   I personally performed the services described in this documentation, which was scribed in my presence. The recorded information has been reviewed and is accurate.    Morgan Baton, MD 11/03/13 [phone]

## 2013-11-02 NOTE — ED Notes (Signed)
Reports recent change in [place]. Pt now on 5mg , down from 10mg . PMD in [place] advised her to get a monitor to monitor her HR.

## 2014-07-28 DIAGNOSIS — I63312 Cerebral infarction due to thrombosis of left middle cerebral artery: Secondary | ICD-10-CM | POA: Insufficient documentation

## 2014-07-28 DIAGNOSIS — R42 Dizziness and giddiness: Secondary | ICD-10-CM | POA: Insufficient documentation

## 2014-07-28 DIAGNOSIS — G450 Vertebro-basilar artery syndrome: Secondary | ICD-10-CM | POA: Insufficient documentation

## 2014-07-28 DIAGNOSIS — I639 Cerebral infarction, unspecified: Secondary | ICD-10-CM | POA: Insufficient documentation

## 2014-12-09 ENCOUNTER — Encounter (HOSPITAL_BASED_OUTPATIENT_CLINIC_OR_DEPARTMENT_OTHER): Payer: Self-pay | Admitting: Emergency Medicine

## 2014-12-09 ENCOUNTER — Emergency Department (HOSPITAL_BASED_OUTPATIENT_CLINIC_OR_DEPARTMENT_OTHER)
Admission: EM | Admit: 2014-12-09 | Discharge: 2014-12-09 | Disposition: A | Discharge status: Home / Self Care | Payer: Medicare Other | Attending: Emergency Medicine | Admitting: Emergency Medicine

## 2014-12-09 DIAGNOSIS — I10 Essential (primary) hypertension: Secondary | ICD-10-CM | POA: Insufficient documentation

## 2014-12-09 DIAGNOSIS — Z8673 Personal history of transient ischemic attack (TIA), and cerebral infarction without residual deficits: Secondary | ICD-10-CM | POA: Diagnosis not present

## 2014-12-09 DIAGNOSIS — Z79899 Other long term (current) drug therapy: Secondary | ICD-10-CM | POA: Insufficient documentation

## 2014-12-09 DIAGNOSIS — Z8719 Personal history of other diseases of the digestive system: Secondary | ICD-10-CM | POA: Diagnosis not present

## 2014-12-09 DIAGNOSIS — Z880 Allergy status to penicillin: Secondary | ICD-10-CM | POA: Insufficient documentation

## 2014-12-09 DIAGNOSIS — R0989 Other specified symptoms and signs involving the circulatory and respiratory systems: Secondary | ICD-10-CM | POA: Insufficient documentation

## 2014-12-09 HISTORY — DX: Cerebral infarction, unspecified: I63.9

## 2014-12-09 MED ORDER — GI COCKTAIL ~~LOC~~
30.0000 mL | Freq: Once | ORAL | Status: AC
  Administered 2014-12-09: 30 mL via ORAL
  Filled 2014-12-09: qty 30

## 2014-12-09 NOTE — ED Notes (Signed)
Pt states she got chocked at dinner and was able to dislodge food (cake) but feels like it is still lodge in her throat, airway patent

## 2014-12-09 NOTE — ED Notes (Signed)
Per EDP give patient fluid to assess swallowing. Pt denies difficulty when swallowing water. Denies pain. NO difficulty breathing.

## 2014-12-09 NOTE — ED Provider Notes (Signed)
CSN: [id number]     Arrival date & time 12/09/14  0155 History   First MD Initiated Contact with Patient 12/09/14 0414     Chief Complaint  Patient presents with  . Swallowed Foreign Body     (Consider location/radiation/quality/duration/timing/severity/associated sxs/prior Treatment) HPI  This is a 81 year old female who choked while eating a piece of pound cake yesterday evening about 7:30 PM. After choking she had the sensation that there was a lump of something in the right side of her throat. There was associated wheezing and coughing. Symptoms were moderate and have improved significantly. She is no longer wheezing, no longer coughing and the foreign body sensation is nearly resolved. She has had no difficulty drinking fluids in the ED. She is having no pain presently.  Past Medical History  Diagnosis Date  . Hypertension   . Gastric ulcer   . Stroke    Past Surgical History  Procedure Laterality Date  . Cholecystectomy    . Tonsillectomy    . Ovarian cyst removal    . Abdominal hysterectomy    . Knee surgery     History reviewed. No pertinent family history. Social History  Substance Use Topics  . Smoking status: Never Smoker   . Smokeless tobacco: None  . Alcohol Use: Yes     Comment: occasional   OB History    No data available     Review of Systems  All other systems reviewed and are negative.   Allergies  Penicillins  Home Medications   Prior to Admission medications   Medication Sig Start Date End Date Taking? Authorizing Provider  amLODipine (NORVASC) 5 MG tablet Take 5 mg by mouth daily.   Yes Historical Provider, MD  atorvastatin (LIPITOR) 40 MG tablet Take 40 mg by mouth daily.   Yes Historical Provider, MD  Calcium Carbonate-Vit D-Min (CALCIUM 1200 PO) Take by mouth.   Yes Historical Provider, MD  Cetirizine HCl (ZYRTEC PO) Take by mouth.   Yes Historical Provider, MD  clopidogrel (PLAVIX) 75 MG tablet Take 75 mg by mouth daily.   Yes Historical  Provider, MD  cyanocobalamin 1000 MCG tablet Take 100 mcg by mouth daily.   Yes Historical Provider, MD  diazepam (VALIUM) 5 MG tablet Take 5 mg by mouth at bedtime as needed for anxiety.   Yes Historical Provider, MD  hydrALAZINE (APRESOLINE) 10 MG tablet Take 30 mg by mouth daily.   Yes Historical Provider, MD  hydrochlorothiazide (HYDRODIURIL) 12.5 MG tablet Take 12.5 mg by mouth daily.   Yes Historical Provider, MD  Multiple Vitamin (MULTIVITAMIN) capsule Take 1 capsule by mouth daily.   Yes Historical Provider, MD  omeprazole (PRILOSEC) 40 MG capsule Take 40 mg by mouth daily.   Yes Historical Provider, MD  VITAMIN D, CHOLECALCIFEROL, PO Take by mouth.   Yes Historical Provider, MD  cloNIDine (CATAPRES) 0.1 MG tablet Take 1 tablet (0.1 mg total) by mouth 2 (two) times daily. 10/09/12   Geoffery Lyons, MD   BP 192/73 mmHg  Pulse 67  Temp(Src) 97.9 F (36.6 C) (Oral)  Resp 20  Ht 5\' 1"  (1.549 m)  Wt 172 lb (78.019 kg)  BMI 32.52 kg/m2  SpO2 95%   Physical Exam  General: Well-developed, well-nourished female in no acute distress; appearance consistent with age of record HENT: normocephalic; atraumatic; pharynx normal; no dysphonia; no stridor Eyes: pupils equal, round and reactive to light; extraocular muscles intact Neck: supple Heart: regular rate and rhythm Lungs: clear to auscultation bilaterally  Abdomen: soft; nondistended; nontender; no masses or hepatosplenomegaly; bowel sounds present Extremities: No deformity; full range of motion; pulses normal; trace edema of lower legs Neurologic: Awake, alert and oriented; motor function intact in all extremities and symmetric; no facial droop Skin: Warm and dry Psychiatric: Normal mood and affect    ED Course  Procedures (including critical care time)   MDM      Paula Libra, MD 12/09/14 [phone]

## 2014-12-29 DIAGNOSIS — I517 Cardiomegaly: Secondary | ICD-10-CM | POA: Insufficient documentation

## 2014-12-29 DIAGNOSIS — I10 Essential (primary) hypertension: Secondary | ICD-10-CM | POA: Insufficient documentation

## 2014-12-29 DIAGNOSIS — R001 Bradycardia, unspecified: Secondary | ICD-10-CM | POA: Insufficient documentation

## 2014-12-29 DIAGNOSIS — N183 Chronic kidney disease, stage 3 unspecified: Secondary | ICD-10-CM | POA: Insufficient documentation

## 2014-12-29 DIAGNOSIS — Z8673 Personal history of transient ischemic attack (TIA), and cerebral infarction without residual deficits: Secondary | ICD-10-CM | POA: Insufficient documentation

## 2014-12-29 DIAGNOSIS — R1312 Dysphagia, oropharyngeal phase: Secondary | ICD-10-CM | POA: Insufficient documentation

## 2015-01-19 IMAGING — CR DG CHEST 2V
2 series · 2 of 2 positions shown · non-contrast
Comparison: None.

CLINICAL DATA: Palpitations, shortness of breath.

EXAM:
CHEST  2 VIEW

[w chest pa]
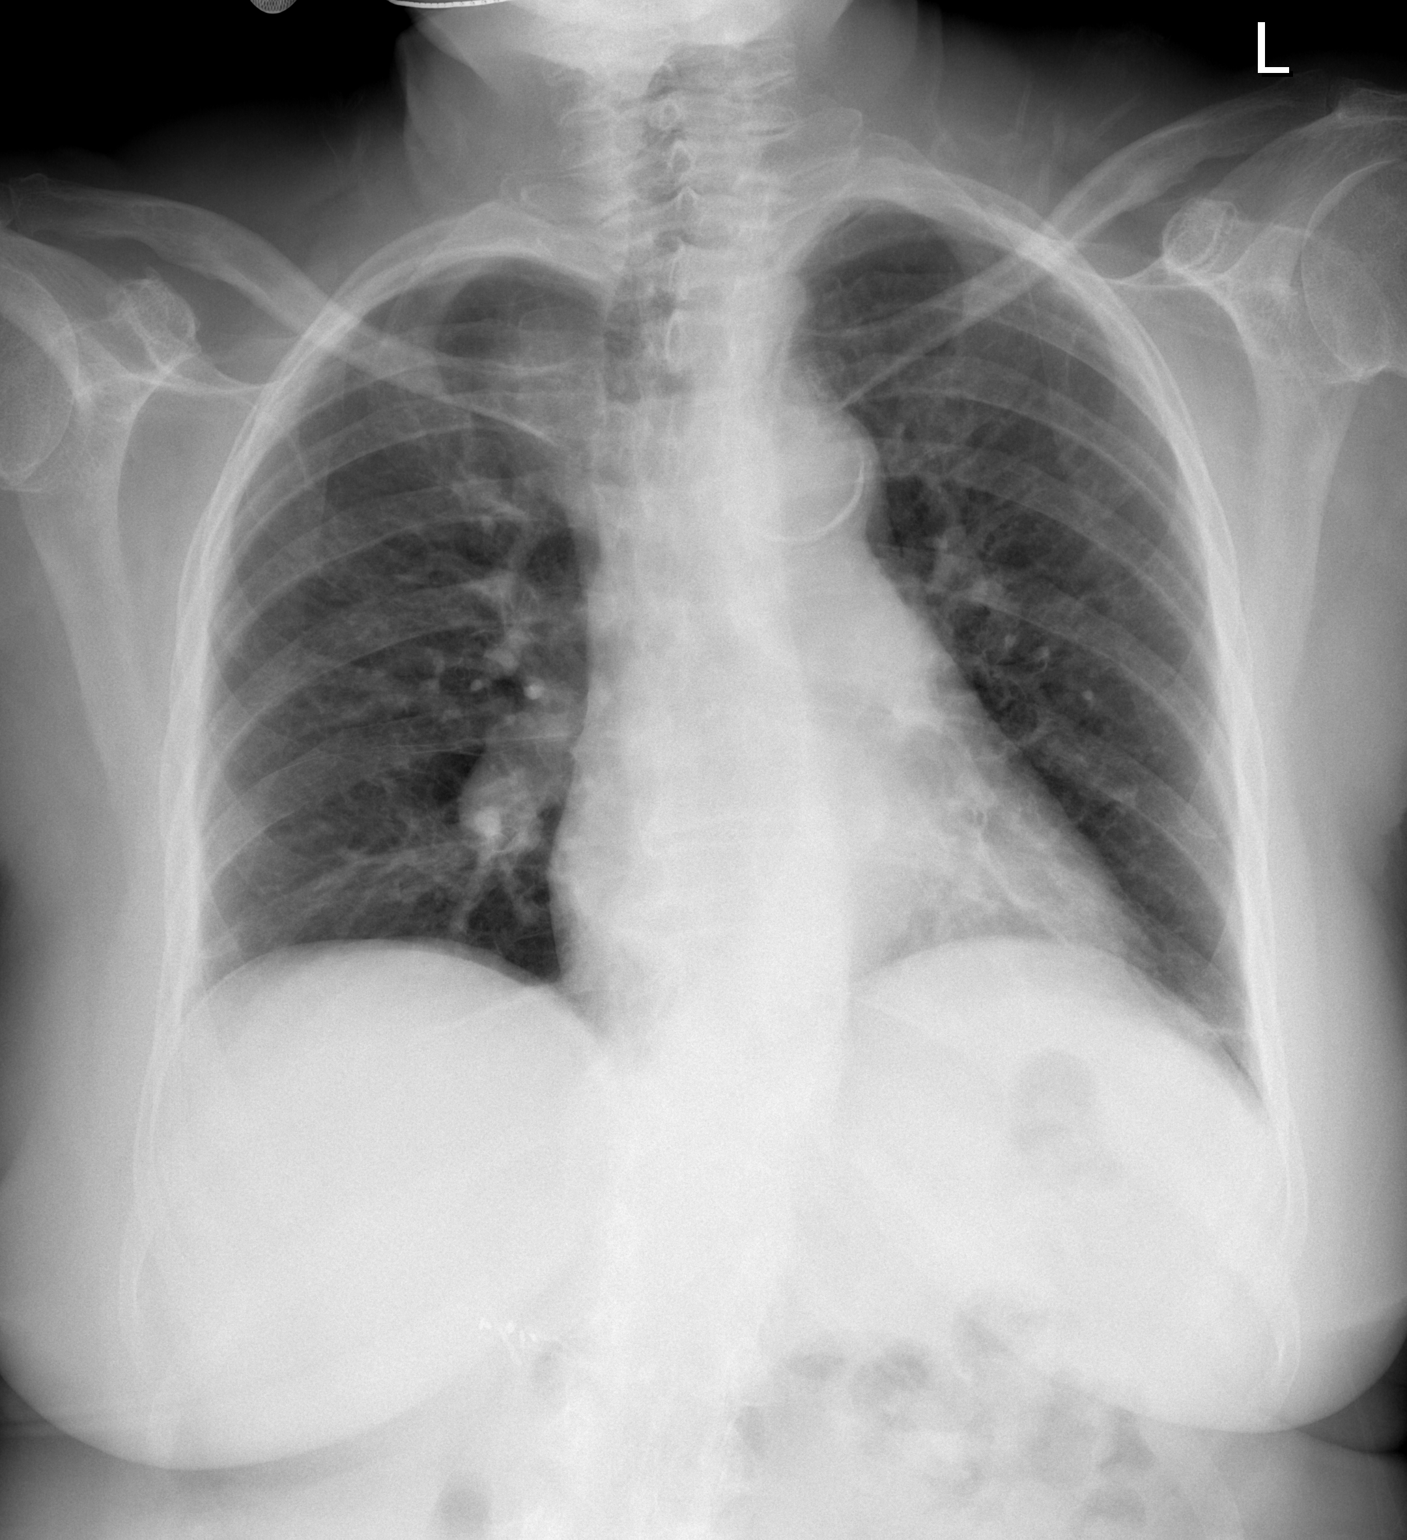

[w chest lat]
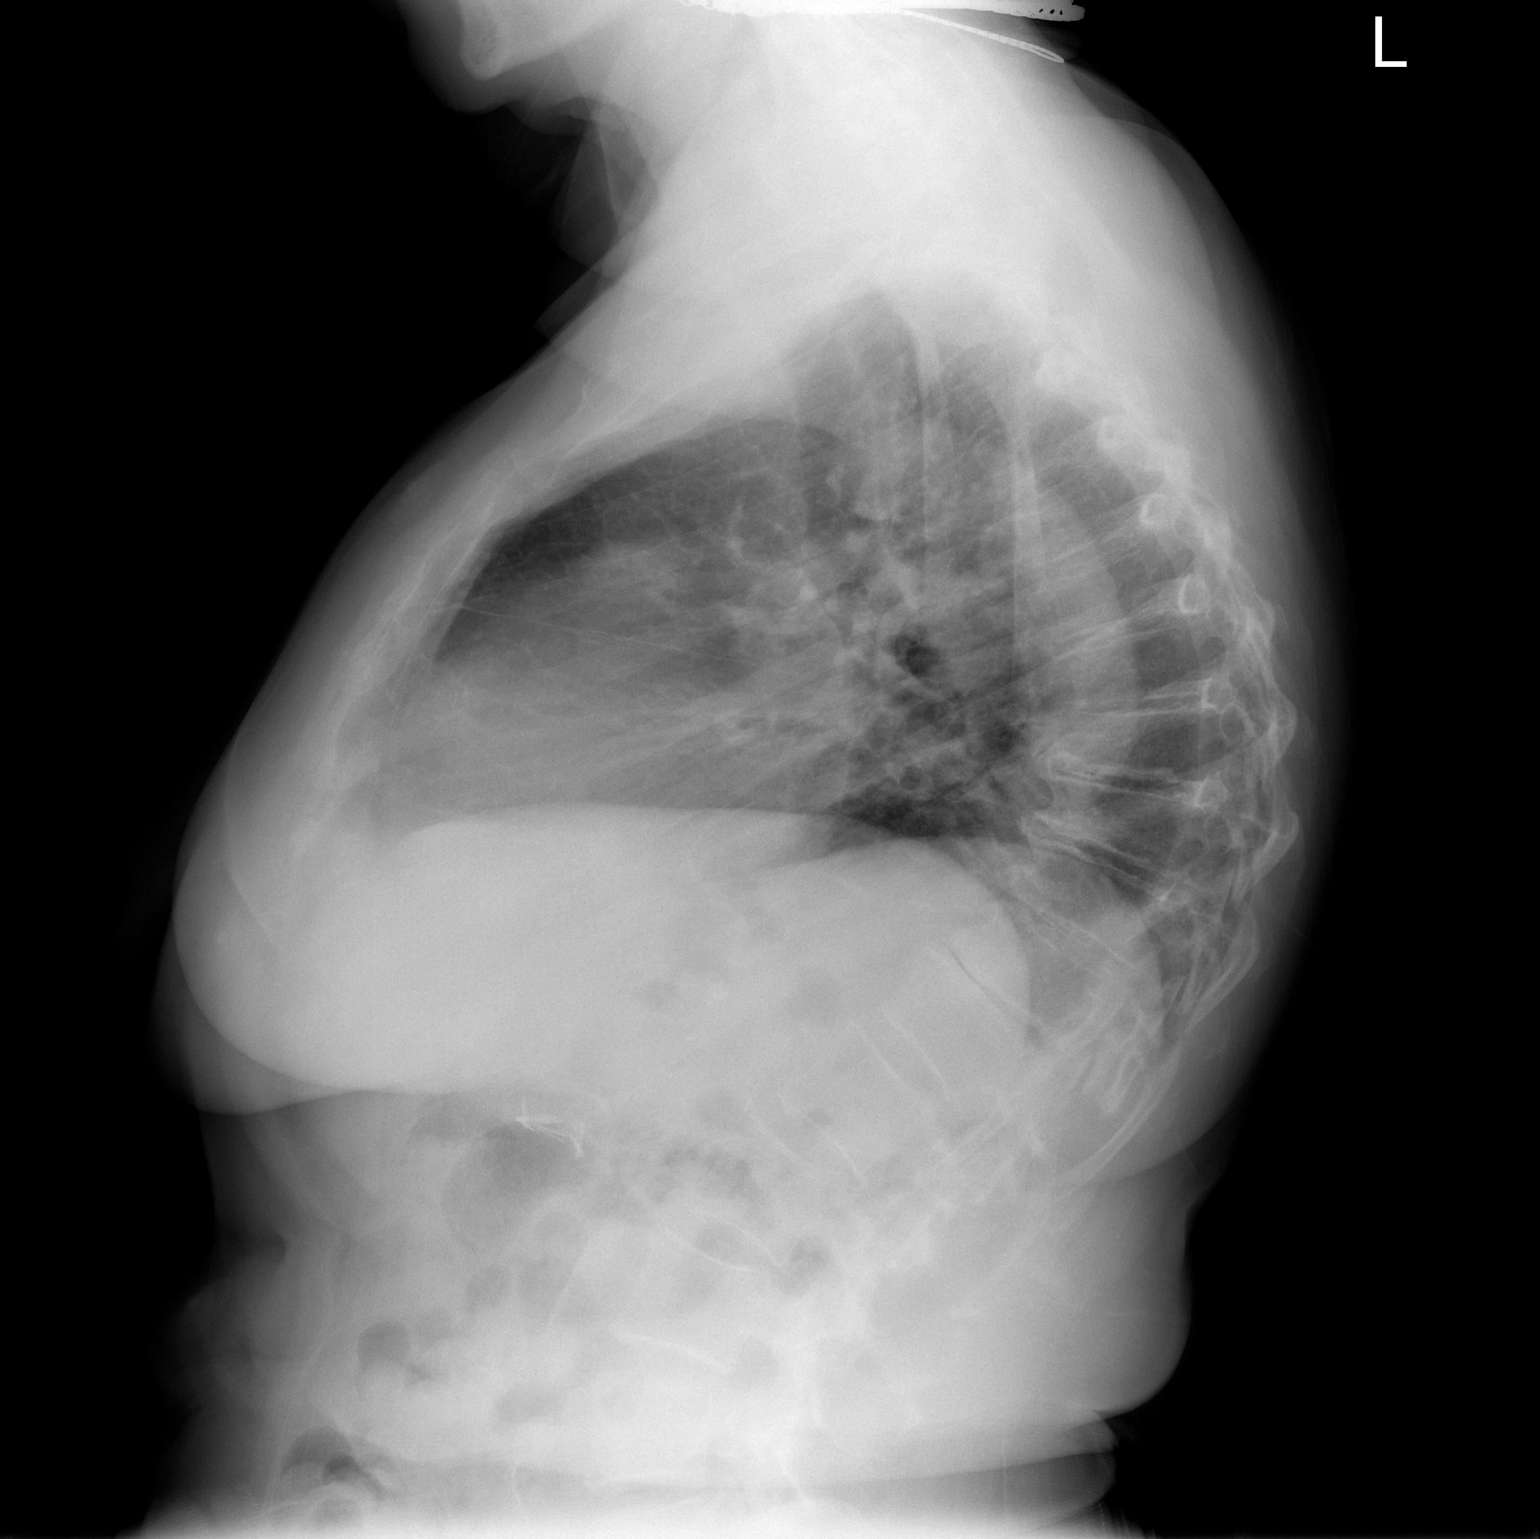

[2 of 2 positions shown; findings below may reference images not displayed]

FINDINGS: Heart is mildly enlarged. Linear densities in the left base, likely
scarring. No confluent opacity on the right. No effusions. No edema.

Degenerative changes in the thoracic spine. Mild multilevel
compression fractures in the mid to lower lumbar spine.
IMPRESSION: Cardiomegaly.

Left basilar scarring.

## 2015-02-04 DIAGNOSIS — R072 Precordial pain: Secondary | ICD-10-CM | POA: Insufficient documentation

## 2015-07-05 DIAGNOSIS — H47333 Pseudopapilledema of optic disc, bilateral: Secondary | ICD-10-CM | POA: Insufficient documentation

## 2015-07-05 DIAGNOSIS — K219 Gastro-esophageal reflux disease without esophagitis: Secondary | ICD-10-CM | POA: Insufficient documentation

## 2015-07-05 DIAGNOSIS — R51 Headache: Secondary | ICD-10-CM

## 2015-07-05 DIAGNOSIS — E039 Hypothyroidism, unspecified: Secondary | ICD-10-CM | POA: Insufficient documentation

## 2015-07-05 DIAGNOSIS — G4733 Obstructive sleep apnea (adult) (pediatric): Secondary | ICD-10-CM | POA: Insufficient documentation

## 2015-07-05 DIAGNOSIS — R6889 Other general symptoms and signs: Secondary | ICD-10-CM | POA: Insufficient documentation

## 2015-07-05 DIAGNOSIS — H02834 Dermatochalasis of left upper eyelid: Secondary | ICD-10-CM

## 2015-07-05 DIAGNOSIS — R519 Headache, unspecified: Secondary | ICD-10-CM | POA: Insufficient documentation

## 2015-07-05 DIAGNOSIS — Z0000 Encounter for general adult medical examination without abnormal findings: Secondary | ICD-10-CM | POA: Insufficient documentation

## 2015-07-05 DIAGNOSIS — R490 Dysphonia: Secondary | ICD-10-CM | POA: Insufficient documentation

## 2015-07-05 DIAGNOSIS — Z91018 Allergy to other foods: Secondary | ICD-10-CM | POA: Insufficient documentation

## 2015-07-05 DIAGNOSIS — H43812 Vitreous degeneration, left eye: Secondary | ICD-10-CM | POA: Insufficient documentation

## 2015-07-05 DIAGNOSIS — H02831 Dermatochalasis of right upper eyelid: Secondary | ICD-10-CM | POA: Insufficient documentation

## 2015-07-05 DIAGNOSIS — H35363 Drusen (degenerative) of macula, bilateral: Secondary | ICD-10-CM | POA: Insufficient documentation

## 2015-07-05 DIAGNOSIS — R0982 Postnasal drip: Secondary | ICD-10-CM | POA: Insufficient documentation

## 2015-07-05 DIAGNOSIS — H52223 Regular astigmatism, bilateral: Secondary | ICD-10-CM | POA: Insufficient documentation

## 2015-07-05 DIAGNOSIS — R0989 Other specified symptoms and signs involving the circulatory and respiratory systems: Secondary | ICD-10-CM | POA: Insufficient documentation

## 2015-07-05 DIAGNOSIS — H9190 Unspecified hearing loss, unspecified ear: Secondary | ICD-10-CM | POA: Insufficient documentation

## 2015-07-10 DIAGNOSIS — M7120 Synovial cyst of popliteal space [Baker], unspecified knee: Secondary | ICD-10-CM | POA: Insufficient documentation

## 2015-07-18 DIAGNOSIS — G451 Carotid artery syndrome (hemispheric): Secondary | ICD-10-CM | POA: Insufficient documentation

## 2015-12-27 DIAGNOSIS — Z9181 History of falling: Secondary | ICD-10-CM | POA: Insufficient documentation

## 2016-10-09 DIAGNOSIS — R490 Dysphonia: Secondary | ICD-10-CM | POA: Insufficient documentation

## 2016-10-09 DIAGNOSIS — J383 Other diseases of vocal cords: Secondary | ICD-10-CM | POA: Insufficient documentation

## 2017-05-02 DIAGNOSIS — M170 Bilateral primary osteoarthritis of knee: Secondary | ICD-10-CM | POA: Insufficient documentation

## 2017-06-16 DIAGNOSIS — H2181 Floppy iris syndrome: Secondary | ICD-10-CM | POA: Insufficient documentation

## 2017-06-24 DIAGNOSIS — H5703 Miosis: Secondary | ICD-10-CM | POA: Insufficient documentation

## 2017-07-10 ENCOUNTER — Telehealth: Payer: Self-pay

## 2017-07-10 NOTE — Telephone Encounter (Signed)
Sent referral to scheduling [facility] ph# [phone]

## 2017-09-09 ENCOUNTER — Encounter: Payer: Self-pay | Admitting: Internal Medicine

## 2017-09-11 DIAGNOSIS — Z95818 Presence of other cardiac implants and grafts: Secondary | ICD-10-CM | POA: Insufficient documentation

## 2017-09-29 ENCOUNTER — Institutional Professional Consult (permissible substitution): Payer: TRICARE For Life (TFL) | Admitting: Internal Medicine

## 2017-09-29 DIAGNOSIS — M65311 Trigger thumb, right thumb: Secondary | ICD-10-CM | POA: Insufficient documentation

## 2018-03-25 ENCOUNTER — Ambulatory Visit (INDEPENDENT_AMBULATORY_CARE_PROVIDER_SITE_OTHER): Payer: Medicare Other | Admitting: Allergy and Immunology

## 2018-03-25 ENCOUNTER — Encounter: Payer: Self-pay | Admitting: Allergy and Immunology

## 2018-03-25 VITALS — BP 140/74 | HR 60 | Temp 97.7°F | Resp 18 | Ht 59.7 in | Wt 160.2 lb

## 2018-03-25 DIAGNOSIS — Z91018 Allergy to other foods: Secondary | ICD-10-CM

## 2018-03-25 DIAGNOSIS — T7840XD Allergy, unspecified, subsequent encounter: Secondary | ICD-10-CM

## 2018-03-25 DIAGNOSIS — L500 Allergic urticaria: Secondary | ICD-10-CM

## 2018-03-25 DIAGNOSIS — J4530 Mild persistent asthma, uncomplicated: Secondary | ICD-10-CM | POA: Diagnosis not present

## 2018-03-25 DIAGNOSIS — H8113 Benign paroxysmal vertigo, bilateral: Secondary | ICD-10-CM | POA: Insufficient documentation

## 2018-03-25 DIAGNOSIS — Z961 Presence of intraocular lens: Secondary | ICD-10-CM | POA: Insufficient documentation

## 2018-03-25 DIAGNOSIS — J310 Chronic rhinitis: Secondary | ICD-10-CM

## 2018-03-25 DIAGNOSIS — I781 Nevus, non-neoplastic: Secondary | ICD-10-CM | POA: Insufficient documentation

## 2018-03-25 DIAGNOSIS — H04123 Dry eye syndrome of bilateral lacrimal glands: Secondary | ICD-10-CM | POA: Insufficient documentation

## 2018-03-25 DIAGNOSIS — H26493 Other secondary cataract, bilateral: Secondary | ICD-10-CM | POA: Insufficient documentation

## 2018-03-25 MED ORDER — FLUTICASONE PROPIONATE HFA 110 MCG/ACT IN AERO: 2.0000 | INHALATION_SPRAY | Freq: Two times a day (BID) | RESPIRATORY_TRACT | 5 refills | Status: DC

## 2018-03-25 MED ORDER — EPINEPHRINE 0.3 MG/0.3ML IJ SOAJ: INTRAMUSCULAR | 2 refills | Status: AC

## 2018-03-25 MED ORDER — ALBUTEROL SULFATE HFA 108 (90 BASE) MCG/ACT IN AERS
1.0000 | INHALATION_SPRAY | Freq: Four times a day (QID) | RESPIRATORY_TRACT | 2 refills | Status: DC | PRN
Start: 2018-03-25 — End: 2018-04-30

## 2018-03-25 MED ORDER — AZELASTINE HCL 0.1 % NA SOLN: NASAL | 5 refills | Status: DC

## 2018-03-25 NOTE — Assessment & Plan Note (Signed)
Todays spirometry results, assessed while asymptomatic, suggest under-perception of bronchoconstriction.  A prescription has been provided for Flovent (fluticasone) 110 g, 2 inhalations twice a day. To maximize pulmonary deposition, a spacer has been provided along with instructions for its proper administration with an HFA inhaler.  A prescription has been provided for albuterol HFA, 1 to 2 inhalations every 4-6 hours if needed.  Subjective and objective measures of pulmonary function will be followed and the treatment plan will be adjusted accordingly.

## 2018-03-25 NOTE — Assessment & Plan Note (Signed)
Possible food allergy.  The patients history suggests tree nut allergy, though todays skin tests were negative despite a positive histamine control.  Food allergen skin testing has excellent negative predictive value however there is still a 5% chance that the allergy exists.  Therefore, we will investigate further with serum specific IgE levels and, if negative, open graded oral challenge.  A laboratory order form has been provided for serum specific IgE against tree nut panel.  Until the food allergy has been definitively ruled out, the patient is to continue meticulous avoidance and have access to epinephrine autoinjector 2 pack.  A prescription has been provided for epinephrine 0.3 mg autoinjector 2 pack along with instructions for its proper administration.

## 2018-03-25 NOTE — Patient Instructions (Addendum)
Chronic rhinitis All seasonal and perennial aeroallergen skin tests are negative despite a positive histamine control.  Intranasal steroids, intranasal antihistamines, and first generation antihistamines are effective for symptoms associated with non-allergic rhinitis, whereas second generation antihistamines such as cetirizine (Zyrtec), loratadine (Claritin) and fexofenadine (Allegra) have been found to be ineffective for this condition.  A prescription has been provided for azelastine nasal spray, one spray per nostril 1-2 times daily as needed. Proper nasal spray technique has been discussed and demonstrated.  Nasal saline spray (i.e., Simply Saline) or nasal saline lavage (i.e., NeilMed) is recommended as needed and prior to medicated nasal sprays.  For thick post nasal drainage, add guaifenesin 600 mg (Mucinex)  twice daily as needed with adequate hydration as discussed.  Mild persistent asthma Todays spirometry results, assessed while asymptomatic, suggest under-perception of bronchoconstriction.  A prescription has been provided for Flovent (fluticasone) 110 g,  2 inhalations twice a day. To maximize pulmonary deposition, a spacer has been provided along with instructions for its proper administration with an HFA inhaler.  A prescription has been provided for albuterol HFA, 1 to 2 inhalations every 4-6 hours if needed.  Subjective and objective measures of pulmonary function will be followed and the treatment plan will be adjusted accordingly.  History of food allergy Possible food allergy.  The patients history suggests tree nut allergy, though todays skin tests were negative despite a positive histamine control.  Food allergen skin testing has excellent negative predictive value however there is still a 5% chance that the allergy exists.  Therefore, we will investigate further with serum specific IgE levels and, if negative, open graded oral challenge.  A laboratory order form has  been provided for serum specific IgE against tree nut panel.  Until the food allergy has been definitively ruled out, the patient is to continue meticulous avoidance and have access to epinephrine autoinjector 2 pack.  A prescription has been provided for epinephrine 0.3 mg autoinjector 2 pack along with instructions for its proper administration.   Return in about 3 months (around 06/25/2018), or if symptoms worsen or fail to improve.

## 2018-03-25 NOTE — Progress Notes (Signed)
New Patient Note  RE: Morgan Tyler MRN: [id number] DOB: 12-07-1933 Date of Office Visit: 03/25/2018  Referring provider: Ronnald Collum Primary care provider: Coralee Rud, PA-C  Chief Complaint: Nasal Congestion; Conjunctivitis; and Wheezing   History of present illness: Morgan Tyler is a 84 y.o. female seen today in consultation requested by Roxanne Mins, PA-C.  She complains of thick postnasal drainage, nasal congestion, rhinorrhea, "sneezing attacks", lacrimation, swollen eyelids, and occasional ocular pruritus.  These symptoms have been troublesome over the past year.  No significant seasonal symptom variation has been noted nor have specific environmental triggers been identified.  She attempts to control the symptoms with fexofenadine and/or diphenhydramine. She reports that occasionally she experiences wheezing "like a whistle" from her chest, as well as shortness of breath and coughing.  No specific environmental triggers have been identified.  She currently has no controller or rescue medication.  She is unable to specify how often she has lower respiratory symptoms Morgan Tyler reports that approximately 6 years ago she consumed [place] nuts and developed oral pruritus, dyspnea, and facial flushing.  She has avoided [place] nuts since that time.  More recently, she consumed walnuts and experienced lingular pruritus.  She did not experience concomitant urticaria, angioedema, or cardiopulmonary symptoms with the consumption of walnuts.  She currently does not have an epinephrine autoinjector.  Assessment and plan: Chronic rhinitis All seasonal and perennial aeroallergen skin tests are negative despite a positive histamine control.  Intranasal steroids, intranasal antihistamines, and first generation antihistamines are effective for symptoms associated with non-allergic rhinitis, whereas second generation antihistamines such as cetirizine (Zyrtec), loratadine (Claritin)  and fexofenadine (Allegra) have been found to be ineffective for this condition.  A prescription has been provided for azelastine nasal spray, one spray per nostril 1-2 times daily as needed. Proper nasal spray technique has been discussed and demonstrated.  Nasal saline spray (i.e., Simply Saline) or nasal saline lavage (i.e., NeilMed) is recommended as needed and prior to medicated nasal sprays.  For thick post nasal drainage, add guaifenesin 600 mg (Mucinex)  twice daily as needed with adequate hydration as discussed.  Mild persistent asthma Todays spirometry results, assessed while asymptomatic, suggest under-perception of bronchoconstriction.  A prescription has been provided for Flovent (fluticasone) 110 g,  2 inhalations twice a day. To maximize pulmonary deposition, a spacer has been provided along with instructions for its proper administration with an HFA inhaler.  A prescription has been provided for albuterol HFA, 1 to 2 inhalations every 4-6 hours if needed.  Subjective and objective measures of pulmonary function will be followed and the treatment plan will be adjusted accordingly.  History of food allergy Possible food allergy.  The patients history suggests tree nut allergy, though todays skin tests were negative despite a positive histamine control.  Food allergen skin testing has excellent negative predictive value however there is still a 5% chance that the allergy exists.  Therefore, we will investigate further with serum specific IgE levels and, if negative, open graded oral challenge.  A laboratory order form has been provided for serum specific IgE against tree nut panel.  Until the food allergy has been definitively ruled out, the patient is to continue meticulous avoidance and have access to epinephrine autoinjector 2 pack.  A prescription has been provided for epinephrine 0.3 mg autoinjector 2 pack along with instructions for its proper administration.   Meds  ordered this encounter  Medications  . azelastine (ASTELIN) 0.1 % nasal spray    Sig:  1 spray per nostril 1-2 times daily as needed    Dispense:  30 mL    Refill:  5  . fluticasone (FLOVENT HFA) 110 MCG/ACT inhaler    Sig: Inhale 2 puffs into the lungs 2 (two) times daily.    Dispense:  1 Inhaler    Refill:  5  . albuterol (PROVENTIL HFA;VENTOLIN HFA) 108 (90 Base) MCG/ACT inhaler    Sig: Inhale 1-2 puffs into the lungs every 6 (six) hours as needed for wheezing or shortness of breath.    Dispense:  1 Inhaler    Refill:  2  . EPINEPHrine (EPIPEN 2-PAK) 0.3 mg/0.3 mL IJ SOAJ injection    Sig: Use as directed for severe allergic reactions    Dispense:  2 Device    Refill:  2    Diagnostics: Spirometry: FVC was 1.20 L and FEV1 was 0.98 L (66% predicted) with significant (290 mL, 30%) postbronchodilator improvement.  This study was performed while the patient was asymptomatic.  Please see scanned spirometry results for details. Epicutaneous testing: Negative despite a positive histamine control. Intradermal testing: Negative. Food allergen skin testing: Negative despite a positive histamine control.    Physical examination: Blood pressure 140/74, pulse 60, temperature 97.7 F (36.5 C), temperature source Oral, resp. rate 18, height 4' 11.7" (1.516 m), weight 160 lb 3.2 oz (72.7 kg), SpO2 98 %.  General: Alert, interactive, in no acute distress. HEENT: TMs pearly gray, turbinates moderately edematous with thick discharge, post-pharynx moderately erythematous. Neck: Supple without lymphadenopathy. Lungs: Mildly decreased breath sounds bilaterally without wheezing, rhonchi or rales. CV: Normal S1, S2 without murmurs. Abdomen: Nondistended, nontender. Skin: Warm and dry, without lesions or rashes. Extremities:  No clubbing, cyanosis or edema. Neuro:   Grossly intact.  Review of systems:  Review of systems negative except as noted in HPI / PMHx or noted below: Review of Systems    Constitutional: Negative.   HENT: Negative.   Eyes: Negative.   Respiratory: Negative.   Cardiovascular: Negative.   Gastrointestinal: Negative.   Genitourinary: Negative.   Musculoskeletal: Negative.   Skin: Negative.   Neurological: Negative.   Endo/Heme/Allergies: Negative.   Psychiatric/Behavioral: Negative.     Past medical history:  Past Medical History:  Diagnosis Date  . Abdominal pain, acute, generalized   . Alopecia   . Anemia   . Anticoagulated   . Arterial ischemic stroke (HCC)    CHRONIC, RECURRENT SMALL VESSEL ISCHEMIC STROKES; PATIENT SHOULD CONTINUE ANTIHYPERTENSIVE MEDICATIONS AS PRESCRIBED BY HER PCP, AS WELL AS , PLAVIX  . Ataxia   . Cerebrovascular accident, late effects   . CHF (congestive heart failure) (HCC)   . Chronic intractable headache   . CKD (chronic kidney disease)   . Colon polyp   . CVA (cerebral vascular accident) (HCC)   . DOE (dyspnea on exertion)   . Gastric ulcer   . Hyperglycemia   . Hyperlipidemia   . Hypertension   . Hypertensive cardiomegaly with heart failure (HCC)   . Hypothyroid   . Iron deficiency   . Left atrial enlargement   . Mild aortic insufficiency   . Oropharyngeal dysphagia   . OSA (obstructive sleep apnea)   . Pulmonary hypertension (HCC)   . PVD (peripheral vascular disease) (HCC)   . Sinus bradycardia   . Stroke (HCC)   . Vitamin D deficiency   . Vocal cord paralysis     Past surgical history:  Past Surgical History:  Procedure Laterality Date  . ABDOMINAL HYSTERECTOMY    .  CHOLECYSTECTOMY    . KNEE SURGERY    . NASAL SINUS SURGERY    . OVARIAN CYST REMOVAL    . TONSILLECTOMY      Family history: Family History  Problem Relation Age of Onset  . Diabetes Mellitus II Maternal Grandmother   . Hypertension Maternal Grandmother   . Heart disease Maternal Grandfather   . Hypertension Maternal Grandfather   . Asthma Daughter   . Eczema Daughter   . Asthma Son   . Allergic rhinitis Neg Hx   .  Angioedema Neg Hx   . Immunodeficiency Neg Hx   . Urticaria Neg Hx     Social history: Social History   Socioeconomic History  . Marital status: Divorced    Spouse name: Not on file  . Number of children: Not on file  . Years of education: Not on file  . Highest education level: Not on file  Occupational History  . Not on file  Social Needs  . Financial resource strain: Not on file  . Food insecurity:    Worry: Not on file    Inability: Not on file  . Transportation needs:    Medical: Not on file    Non-medical: Not on file  Tobacco Use  . Smoking status: Never Smoker  . Smokeless tobacco: Never Used  Substance and Sexual Activity  . Alcohol use: Not Currently  . Drug use: No  . Sexual activity: Not on file  Lifestyle  . Physical activity:    Days per week: Not on file    Minutes per session: Not on file  . Stress: Not on file  Relationships  . Social connections:    Talks on phone: Not on file    Gets together: Not on file    Attends religious service: Not on file    Active member of club or organization: Not on file    Attends meetings of clubs or organizations: Not on file    Relationship status: Not on file  . Intimate partner violence:    Fear of current or ex partner: Not on file    Emotionally abused: Not on file    Physically abused: Not on file    Forced sexual activity: Not on file  Other Topics Concern  . Not on file  Social History Narrative  . Not on file   Environmental History: The patient lives in a house with carpeting the bedroom, gas heat, and central air.  There is no known mold/water damage in the home.  She is a non-smoker without pets.  Allergies as of 03/25/2018      Reactions   Other Rash, Other (See Comments), Shortness Of Breath   BRAZILIAN NUTS, DIFFICULTY BREATHING  Other reaction(s): DIFFICULTY BREATHING, RASH [place] Nuts only Other reaction(s): DIFFICULTY BREATHING, RASH [place] Nuts only Other reaction(s): DIFFICULTY  BREATHING, RASH [place] Nuts only   Black Walnut Pollen Allergy Skin Test    Penicillins Rash      Medication List        Accurate as of 03/25/18 12:45 PM. Always use your most recent med list.          albuterol 108 (90 Base) MCG/ACT inhaler Commonly known as:  PROVENTIL HFA;VENTOLIN HFA Inhale 1-2 puffs into the lungs every 6 (six) hours as needed for wheezing or shortness of breath.   aspirin 325 MG tablet Take 325 mg by mouth daily.   atorvastatin 40 MG tablet Commonly known as:  LIPITOR Take 40 mg by  mouth daily.   azelastine 0.1 % nasal spray Commonly known as:  ASTELIN 1 spray per nostril 1-2 times daily as needed   cyanocobalamin 1000 MCG tablet Take 100 mcg by mouth daily.   diazepam 5 MG tablet Commonly known as:  VALIUM Take 5 mg by mouth at bedtime as needed for anxiety.   EPINEPHrine 0.3 mg/0.3 mL Soaj injection Commonly known as:  EPI-PEN Use as directed for severe allergic reactions   esomeprazole 40 MG capsule Commonly known as:  NEXIUM Take 40 mg by mouth daily at 12 noon.   fluticasone 110 MCG/ACT inhaler Commonly known as:  FLOVENT HFA Inhale 2 puffs into the lungs 2 (two) times daily.   fluticasone 50 MCG/ACT nasal spray Commonly known as:  FLONASE Place 1-2 sprays into both nostrils daily.   hydrochlorothiazide 12.5 MG tablet Commonly known as:  HYDRODIURIL Take 12.5 mg by mouth daily.   levothyroxine 50 MCG tablet Commonly known as:  SYNTHROID, LEVOTHROID Take 50 mcg by mouth daily before breakfast.   NORVASC 10 MG tablet Generic drug:  amLODipine Take 10 mg by mouth daily.   ONE-A-DAY WOMENS FORMULA PO Take 1 tablet by mouth daily.   ZYRTEC PO Take by mouth.       Known medication allergies: Allergies  Allergen Reactions  . Other Rash, Other (See Comments) and Shortness Of Breath    BRAZILIAN NUTS, DIFFICULTY BREATHING  Other reaction(s): DIFFICULTY BREATHING, RASH  [place] Nuts only Other reaction(s): DIFFICULTY  BREATHING, RASH  [place] Nuts only Other reaction(s): DIFFICULTY BREATHING, RASH  [place] Nuts only   . Black Walnut Pollen Allergy Skin Test   . Penicillins Rash    I appreciate the opportunity to take part in Anahita's care. Please do not hesitate to contact me with questions.  Sincerely,   R. Jorene Guest, MD

## 2018-03-25 NOTE — Assessment & Plan Note (Signed)
All seasonal and perennial aeroallergen skin tests are negative despite a positive histamine control.  Intranasal steroids, intranasal antihistamines, and first generation antihistamines are effective for symptoms associated with non-allergic rhinitis, whereas second generation antihistamines such as cetirizine (Zyrtec), loratadine (Claritin) and fexofenadine (Allegra) have been found to be ineffective for this condition.  A prescription has been provided for azelastine nasal spray, one spray per nostril 1-2 times daily as needed. Proper nasal spray technique has been discussed and demonstrated.  Nasal saline spray (i.e., Simply Saline) or nasal saline lavage (i.e., NeilMed) is recommended as needed and prior to medicated nasal sprays.  For thick post nasal drainage, add guaifenesin 600 mg (Mucinex)  twice daily as needed with adequate hydration as discussed.

## 2018-04-01 LAB — ALLERGEN COCONUT IGE: Allergen Coconut IgE: <0.1 kU/L

## 2018-04-01 LAB — ALLERGEN PISTACHIO F203: F203-IgE Pistachio Nut: <0.1 kU/L

## 2018-04-01 LAB — ALLERGENS(7)
Brazil Nut IgE: <0.1 kU/L
F020-IgE Almond: <0.1 kU/L
F202-IgE Cashew Nut: <0.1 kU/L
Hazelnut (Filbert) IgE: <0.1 kU/L
Peanut IgE: <0.1 kU/L
Pecan Nut IgE: <0.1 kU/L
Walnut IgE: <0.1 kU/L

## 2018-04-04 LAB — THYROGLOBULIN LEVEL: Thyroglobulin (TG-RIA): 9 ng/mL

## 2018-04-04 LAB — TRYPTASE: Tryptase: 8.1 ug/L (ref 2.2–13.2)

## 2018-04-04 LAB — THYROID PEROXIDASE ANTIBODY: Thyroperoxidase Ab SerPl-aCnc: 11 IU/mL (ref 0–34)

## 2018-04-30 ENCOUNTER — Other Ambulatory Visit: Payer: Self-pay

## 2018-04-30 MED ORDER — AZELASTINE HCL 0.1 % NA SOLN: NASAL | 5 refills | Status: AC

## 2018-04-30 MED ORDER — FLUTICASONE PROPIONATE HFA 110 MCG/ACT IN AERO: 2.0000 | INHALATION_SPRAY | Freq: Two times a day (BID) | RESPIRATORY_TRACT | 5 refills | Status: AC

## 2018-04-30 MED ORDER — ALBUTEROL SULFATE HFA 108 (90 BASE) MCG/ACT IN AERS: 1.0000 | INHALATION_SPRAY | Freq: Four times a day (QID) | RESPIRATORY_TRACT | 2 refills | Status: DC | PRN

## 2018-06-18 ENCOUNTER — Ambulatory Visit: Payer: Medicare Other | Admitting: Allergy and Immunology

## 2018-07-07 ENCOUNTER — Telehealth: Payer: Self-pay

## 2018-07-07 NOTE — Telephone Encounter (Signed)
Pt came in with a bill from neb doctor for $90 for 1 spacer. I called neb doctore for the pt and explained we only gave pt 1 spacer and that she was duel covered with tricare and medicare a & b. They only had record of the medicare. Medicare does not cover the spacer from neb doctor. They are going to rerun the correct amount $25 for a spacer with tricare and try to get it approved. Informed pt and wrote down the Billing departments number and nam of Herbert Seta the women I spoke with.

## 2018-07-20 ENCOUNTER — Telehealth: Payer: Self-pay

## 2018-07-20 NOTE — Telephone Encounter (Signed)
Spoke with pt once again today about her bill from neb doctor as she received another bill this time for $50. Joe fron [facility] said that the bill was mailed out march 13th but the update from insurance was not done till march 16th. An pt should not have to pay anything. I said ok I will let pt know. Pt informed and also told her to let me know if she receives another bill from them.

## 2019-03-30 ENCOUNTER — Other Ambulatory Visit: Payer: Self-pay | Admitting: Allergy and Immunology

## 2019-11-23 ENCOUNTER — Other Ambulatory Visit: Payer: Self-pay | Admitting: Allergy and Immunology

## 2022-07-29 DEATH — deceased
